# Patient Record
Sex: Female | Born: 1985 | Race: Black or African American | Hispanic: No | Marital: Married | Smoking: Never smoker
Health system: Southern US, Community
[De-identification: ages and names within clinical notes are randomized; demographics above are authoritative.]

## PROBLEM LIST (undated history)

## (undated) DIAGNOSIS — D649 Anemia, unspecified: Secondary | ICD-10-CM

## (undated) DIAGNOSIS — J40 Bronchitis, not specified as acute or chronic: Secondary | ICD-10-CM

---
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
## 2006-12-23 ENCOUNTER — Emergency Department (HOSPITAL_COMMUNITY): Admission: EM | Admit: 2006-12-23 | Discharge: 2006-12-23 | Payer: Self-pay | Admitting: Emergency Medicine

## 2007-09-01 ENCOUNTER — Ambulatory Visit: Payer: Self-pay | Admitting: Internal Medicine

## 2007-09-04 ENCOUNTER — Ambulatory Visit: Payer: Self-pay | Admitting: Internal Medicine

## 2008-08-19 ENCOUNTER — Emergency Department (HOSPITAL_COMMUNITY): Admission: EM | Admit: 2008-08-19 | Discharge: 2008-08-19 | Payer: Self-pay | Admitting: Emergency Medicine

## 2008-08-20 ENCOUNTER — Ambulatory Visit: Payer: Self-pay | Admitting: *Deleted

## 2009-05-29 ENCOUNTER — Ambulatory Visit: Payer: Self-pay | Admitting: Internal Medicine

## 2009-06-19 ENCOUNTER — Ambulatory Visit: Payer: Self-pay | Admitting: Internal Medicine

## 2009-07-07 ENCOUNTER — Ambulatory Visit: Payer: Self-pay | Admitting: Internal Medicine

## 2010-06-22 LAB — RAPID STREP SCREEN (MED CTR MEBANE ONLY): Streptococcus, Group A Screen (Direct): NEGATIVE

## 2010-12-24 LAB — POCT PREGNANCY, URINE
Operator id: 198171
Preg Test, Ur: NEGATIVE

## 2014-03-09 ENCOUNTER — Encounter (HOSPITAL_COMMUNITY): Payer: Self-pay | Admitting: *Deleted

## 2014-03-09 ENCOUNTER — Emergency Department (HOSPITAL_COMMUNITY): Payer: BC Managed Care – PPO

## 2014-03-09 ENCOUNTER — Emergency Department (HOSPITAL_COMMUNITY)
Admission: EM | Admit: 2014-03-09 | Discharge: 2014-03-09 | Disposition: A | Discharge status: Home / Self Care | Payer: BC Managed Care – PPO | Attending: Emergency Medicine | Admitting: Emergency Medicine

## 2014-03-09 DIAGNOSIS — Z3202 Encounter for pregnancy test, result negative: Secondary | ICD-10-CM | POA: Insufficient documentation

## 2014-03-09 DIAGNOSIS — R112 Nausea with vomiting, unspecified: Secondary | ICD-10-CM | POA: Insufficient documentation

## 2014-03-09 DIAGNOSIS — Z880 Allergy status to penicillin: Secondary | ICD-10-CM | POA: Diagnosis not present

## 2014-03-09 DIAGNOSIS — R1013 Epigastric pain: Secondary | ICD-10-CM

## 2014-03-09 DIAGNOSIS — K5900 Constipation, unspecified: Secondary | ICD-10-CM | POA: Insufficient documentation

## 2014-03-09 LAB — COMPREHENSIVE METABOLIC PANEL
ALK PHOS: 68 U/L (ref 39–117)
ALT: 22 U/L (ref 0–35)
AST: 20 U/L (ref 0–37)
Albumin: 3.9 g/dL (ref 3.5–5.2)
Anion gap: 8 (ref 5–15)
BUN: 6 mg/dL (ref 6–23)
CO2: 23 mmol/L (ref 19–32)
CREATININE: 0.78 mg/dL (ref 0.50–1.10)
Calcium: 9.2 mg/dL (ref 8.4–10.5)
Chloride: 105 mEq/L (ref 96–112)
GFR calc Af Amer: >90 mL/min (ref >90)
Glucose, Bld: 97 mg/dL (ref 70–99)
POTASSIUM: 3.4 mmol/L — AB (ref 3.5–5.1)
Sodium: 136 mmol/L (ref 135–145)
Total Bilirubin: 0.6 mg/dL (ref 0.3–1.2)
Total Protein: 7.7 g/dL (ref 6.0–8.3)

## 2014-03-09 LAB — CBC WITH DIFFERENTIAL/PLATELET
Basophils Absolute: 0 10*3/uL (ref 0.0–0.1)
Basophils Relative: 1 % (ref 0–1)
Eosinophils Absolute: 0.1 10*3/uL (ref 0.0–0.7)
Eosinophils Relative: 1 % (ref 0–5)
HEMATOCRIT: 32 % — AB (ref 36.0–46.0)
HEMOGLOBIN: 10 g/dL — AB (ref 12.0–15.0)
Lymphocytes Relative: 21 % (ref 12–46)
Lymphs Abs: 1.2 10*3/uL (ref 0.7–4.0)
MCH: 21.9 pg — ABNORMAL LOW (ref 26.0–34.0)
MCHC: 31.3 g/dL (ref 30.0–36.0)
MCV: 70 fL — ABNORMAL LOW (ref 78.0–100.0)
MONO ABS: 0.3 10*3/uL (ref 0.1–1.0)
MONOS PCT: 5 % (ref 3–12)
NEUTROS ABS: 4 10*3/uL (ref 1.7–7.7)
NEUTROS PCT: 72 % (ref 43–77)
Platelets: 551 10*3/uL — ABNORMAL HIGH (ref 150–400)
RBC: 4.57 MIL/uL (ref 3.87–5.11)
RDW: 17.7 % — ABNORMAL HIGH (ref 11.5–15.5)
WBC: 5.6 10*3/uL (ref 4.0–10.5)

## 2014-03-09 LAB — URINE MICROSCOPIC-ADD ON

## 2014-03-09 LAB — URINALYSIS, ROUTINE W REFLEX MICROSCOPIC
GLUCOSE, UA: NEGATIVE mg/dL
HGB URINE DIPSTICK: NEGATIVE
KETONES UR: 40 mg/dL — AB
NITRITE: NEGATIVE
PH: 7 (ref 5.0–8.0)
Protein, ur: NEGATIVE mg/dL
Specific Gravity, Urine: 1.027 (ref 1.005–1.030)
Urobilinogen, UA: 1 mg/dL (ref 0.0–1.0)

## 2014-03-09 LAB — POC URINE PREG, ED: Preg Test, Ur: NEGATIVE

## 2014-03-09 LAB — LIPASE, BLOOD: LIPASE: 43 U/L (ref 11–59)

## 2014-03-09 MED ORDER — SODIUM CHLORIDE 0.9 % IV BOLUS (SEPSIS): 1000.0000 mL | Freq: Once | INTRAVENOUS | Status: DC

## 2014-03-09 MED ORDER — DICYCLOMINE HCL 20 MG PO TABS: 20.0000 mg | ORAL_TABLET | Freq: Two times a day (BID) | ORAL | Status: DC

## 2014-03-09 MED ORDER — ONDANSETRON 4 MG PO TBDP
8.0000 mg | ORAL_TABLET | Freq: Once | ORAL | Status: AC
  Administered 2014-03-09: 8 mg via ORAL
  Filled 2014-03-09: qty 2

## 2014-03-09 MED ORDER — ONDANSETRON HCL 4 MG/2ML IJ SOLN
4.0000 mg | Freq: Once | INTRAMUSCULAR | Status: DC
  Filled 2014-03-09: qty 2

## 2014-03-09 MED ORDER — ONDANSETRON HCL 4 MG PO TABS
8.0000 mg | ORAL_TABLET | Freq: Once | ORAL | Status: AC
  Administered 2014-03-09: 8 mg via ORAL
  Filled 2014-03-09: qty 2

## 2014-03-09 MED ORDER — POLYETHYLENE GLYCOL 3350 17 GM/SCOOP PO POWD: ORAL | Status: DC

## 2014-03-09 MED ORDER — ONDANSETRON HCL 4 MG PO TABS: 4.0000 mg | ORAL_TABLET | Freq: Three times a day (TID) | ORAL | Status: DC | PRN

## 2014-03-09 NOTE — ED Notes (Signed)
IV team at bedside 

## 2014-03-09 NOTE — ED Notes (Signed)
Unsuccessful attempts to place an iv.

## 2014-03-09 NOTE — ED Notes (Signed)
Updated pt and mother with POC.  Pts mother upset with plan.  PA in room to speak with pt and her mother. Mother left room to go outside to smoke.

## 2014-03-09 NOTE — ED Provider Notes (Signed)
CSN: [id number]     Arrival date & time 03/09/14  1359 History   First MD Initiated Contact with Patient 03/09/14 1640     Chief Complaint  Patient presents with  . Abdominal Pain  . Emesis  . Constipation     (Consider location/radiation/quality/duration/timing/severity/associated sxs/prior Treatment) HPI Carmen Hays is a 28 year old female who presents to the emergency department with chief complaint of epigastric pain, nausea and vomiting. Patient states that for the past 2 days. She's had severe upper abdominal cramping pain with associated vomiting of nonbilious, nonbloody vomitus. She has had associated constipation. 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 Prior to this, the patient was thought to have a dental pain and had been on narcotic pain medication for approximately 3 weeks and had 2 rounds of antibiotics. Patient complains of severe constipation for the past week. She states she did have a significant sized bowel movement 6 days ago, but has not had a bowel movement since. She did not take any medication for constipation. She did take Pepto-Bismol yesterday because of her nausea and vomiting. She has not been able to hold down solid foods. She has been able to hold down liquids intermittently between spells of vomiting. Last menstrual period was 2 weeks ago. She denies any urinary symptoms. She has associated bilateral back pain which is worse with vomiting. . She denies any fevers, chills, myalgias or contacts with similar symptoms. No history of abdominal surgeries.   History reviewed. No pertinent past medical history. History reviewed. No pertinent past surgical history. History reviewed. No pertinent family history. History  Substance Use Topics  . Smoking status: Never Smoker   . Smokeless tobacco: Not on file  . Alcohol Use: No   OB History    No data available     Review of Systems  Ten systems  reviewed and are negative for acute change, except as noted in the HPI.    Allergies  Amoxicillin  Home Medications   Prior to Admission medications   Not on File   BP 104/69 mmHg  Pulse 91  Temp(Src) 98 F (36.7 C) (Oral)  Resp 18  SpO2 99%  LMP 02/23/2014 Physical Exam Physical Exam  Nursing note and vitals reviewed. Constitutional: She is oriented to person, place, and time. She appears well-developed and well-nourished. No distress.  HENT:  Head: Normocephalic and atraumatic.  Eyes: Conjunctivae normal and EOM are normal. Pupils are equal, round, and reactive to light. No scleral icterus.  Neck: Normal range of motion.  Cardiovascular: Normal rate, regular rhythm and normal heart sounds.  Exam reveals no gallop and no friction rub.   No murmur heard. Pulmonary/Chest: Effort normal and breath sounds normal. No respiratory distress.  Abdominal: Soft. Diffuse tenderness. Quiet abdomen with minimal bowel sounds on auscultation. Neurological: She is alert and oriented to person, place, and time.  Skin: Skin is warm and dry. She is not diaphoretic.    ED Course  Procedures (including critical care time) Labs Review Labs Reviewed  CBC WITH DIFFERENTIAL - Abnormal; Notable for the following:    Hemoglobin 10.0 (*)    HCT 32.0 (*)    MCV 70.0 (*)    MCH 21.9 (*)    RDW 17.7 (*)    Platelets 551 (*)    All other components within normal limits  COMPREHENSIVE METABOLIC PANEL - Abnormal; Notable for the following:    Potassium 3.4 (*)    All other components  within normal limits  LIPASE, BLOOD    Imaging Review No results found.   EKG Interpretation None      MDM   Final diagnoses:  Epigastric pain  Non-intractable vomiting with nausea, vomiting of unspecified type  Constipation, unspecified constipation type    5:29 PM BP 108/63 mmHg  Pulse 62  Temp(Src) 98 F (36.7 C) (Oral)  Resp 20  SpO2 100%  LMP 02/23/2014  Patient here with complaint of  constipation, abdominal pain and vomiting. Some mild microcytic anemia. Mild hypokalemia likely due to her vomiting. Lipase is normal with no effort. Chemistry abnormalities. Negative HEENT. Point-of-care urine pregnancy. Have ordered abdominal films.  /Patient is nontoxic, nonseptic appearing, in no apparent distress.  Patient's pain and other symptoms adequately managed in emergency department. Oral fluids given. Patient's appetite has returned..  Labs, imaging and vitals reviewed.  Patient does not meet the SIRS or Sepsis criteria.  On repeat exam patient does not have a surgical abdomin and there are no peritoneal signs.  No indication of appendicitis, bowel obstruction, bowel perforation, cholecystitis, diverticulitis, PID or ectopic pregnancy.  Patient discharged home with symptomatic treatment and given strict instructions for follow-up with their primary care physician.  I have also discussed reasons to return immediately to the ER.  Patient expresses understanding and agrees with plan.     Margarita Mail, PA-C 03/10/14 [place], MD 03/10/14 [phone]

## 2014-03-09 NOTE — Discharge Instructions (Signed)
Abdominal Pain °Many things can cause abdominal pain. Usually, abdominal pain is not caused by a disease and will improve without treatment. It can often be observed and treated at home. Your health care provider will do a physical exam and possibly order blood tests and X-rays to help determine the seriousness of your pain. However, in many cases, more time must pass before a clear cause of the pain can be found. Before that point, your health care provider may not know if you need more testing or further treatment. °HOME CARE INSTRUCTIONS  °Monitor your abdominal pain for any changes. The following actions may help to alleviate any discomfort you are experiencing: °· Only take over-the-counter or prescription medicines as directed by your health care provider. °· Do not take laxatives unless directed to do so by your health care provider. °· Try a clear liquid diet (broth, tea, or water) as directed by your health care provider. Slowly move to a bland diet as tolerated. °SEEK MEDICAL CARE IF: °· You have unexplained abdominal pain. °· You have abdominal pain associated with nausea or diarrhea. °· You have pain when you urinate or have a bowel movement. °· You experience abdominal pain that wakes you in the night. °· You have abdominal pain that is worsened or improved by eating food. °· You have abdominal pain that is worsened with eating fatty foods. °· You have a fever. °SEEK IMMEDIATE MEDICAL CARE IF:  °· Your pain does not go away within 2 hours. °· You keep throwing up (vomiting). °· Your pain is felt only in portions of the abdomen, such as the right side or the left lower portion of the abdomen. °· You pass bloody or black tarry stools. °MAKE SURE YOU: °· Understand these instructions.   °· Will watch your condition.   °· Will get help right away if you are not doing well or get worse.   °Document Released: 12/09/2004 Document Revised: 03/06/2013 Document Reviewed: 11/08/2012 °[employer]® Patient Information  ©2015 [employer]. This information is not intended to replace advice given to you by your health care provider. Make sure you discuss any questions you have with your health care provider. ° °Constipation °Constipation is when a person has fewer than three bowel movements a week, has difficulty having a bowel movement, or has stools that are dry, hard, or larger than normal. As people grow older, constipation is more common. If you try to fix constipation with medicines that make you have a bowel movement (laxatives), the problem may get worse. Long-term laxative use may cause the muscles of the colon to become weak. A low-fiber diet, not taking in enough fluids, and taking certain medicines may make constipation worse.  °CAUSES  °· Certain medicines, such as antidepressants, pain medicine, iron supplements, antacids, and water pills.   °· Certain diseases, such as diabetes, irritable bowel syndrome (IBS), thyroid disease, or depression.   °· Not drinking enough water.   °· Not eating enough fiber-rich foods.   °· Stress or travel.   °· Lack of physical activity or exercise.   °· Ignoring the urge to have a bowel movement.   °· Using laxatives too much.   °SIGNS AND SYMPTOMS  °· Having fewer than three bowel movements a week.   °· Straining to have a bowel movement.   °· Having stools that are hard, dry, or larger than normal.   °· Feeling full or bloated.   °· Pain in the lower abdomen.   °· Not feeling relief after having a bowel movement.   °DIAGNOSIS  °Your health care provider will take   a medical history and perform a physical exam. Further testing may be done for severe constipation. Some tests may include:  A barium enema X-ray to examine your rectum, colon, and, sometimes, your small intestine.   A sigmoidoscopy to examine your lower colon.   A colonoscopy to examine your entire colon. TREATMENT  Treatment will depend on the severity of your constipation and what is causing it. Some dietary  treatments include drinking more fluids and eating more fiber-rich foods. Lifestyle treatments may include regular exercise. If these diet and lifestyle recommendations do not help, your health care provider may recommend taking over-the-counter laxative medicines to help you have bowel movements. Prescription medicines may be prescribed if over-the-counter medicines do not work.  HOME CARE INSTRUCTIONS   Eat foods that have a lot of fiber, such as fruits, vegetables, whole grains, and beans.  Limit foods high in fat and processed sugars, such as french fries, hamburgers, cookies, candies, and soda.   A fiber supplement may be added to your diet if you cannot get enough fiber from foods.   Drink enough fluids to keep your urine clear or pale yellow.   Exercise regularly or as directed by your health care provider.   Go to the restroom when you have the urge to go. Do not hold it.   Only take over-the-counter or prescription medicines as directed by your health care provider. Do not take other medicines for constipation without talking to your health care provider first.  [place] IF:   You have bright red blood in your stool.   Your constipation lasts for more than 4 days or gets worse.   You have abdominal or rectal pain.   You have thin, pencil-like stools.   You have unexplained weight loss. MAKE SURE YOU:   Understand these instructions.  Will watch your condition.  Will get help right away if you are not doing well or get worse. Document Released: 11/28/2003 Document Revised: 03/06/2013 Document Reviewed: 12/11/2012 [facility] Patient Information 2015 [place], [place]. This information is not intended to replace advice given to you by your health care provider. Make sure you discuss any questions you have with your health care provider.  Nausea and Vomiting Nausea is a sick feeling that often comes before throwing up (vomiting). Vomiting is a  reflex where stomach contents come out of your mouth. Vomiting can cause severe loss of body fluids (dehydration). Children and elderly adults can become dehydrated quickly, especially if they also have diarrhea. Nausea and vomiting are symptoms of a condition or disease. It is important to find the cause of your symptoms. CAUSES   Direct irritation of the stomach lining. This irritation can result from increased acid production (gastroesophageal reflux disease), infection, food poisoning, taking certain medicines (such as nonsteroidal anti-inflammatory drugs), alcohol use, or tobacco use.  Signals from the brain.These signals could be caused by a headache, heat exposure, an inner ear disturbance, increased pressure in the brain from injury, infection, a tumor, or a concussion, pain, emotional stimulus, or metabolic problems.  An obstruction in the gastrointestinal tract (bowel obstruction).  Illnesses such as diabetes, hepatitis, gallbladder problems, appendicitis, kidney problems, cancer, sepsis, atypical symptoms of a heart attack, or eating disorders.  Medical treatments such as chemotherapy and radiation.  Receiving medicine that makes you sleep (general anesthetic) during surgery. DIAGNOSIS Your caregiver may ask for tests to be done if the problems do not improve after a few days. Tests may also be done if symptoms  are severe or if the reason for the nausea and vomiting is not clear. Tests may include:  Urine tests.  Blood tests.  Stool tests.  Cultures (to look for evidence of infection).  X-rays or other imaging studies. Test results can help your caregiver make decisions about treatment or the need for additional tests. TREATMENT You need to stay well hydrated. Drink frequently but in small amounts.You may wish to drink water, sports drinks, clear broth, or eat frozen ice pops or gelatin dessert to help stay hydrated.When you eat, eating slowly may help prevent nausea.There  are also some antinausea medicines that may help prevent nausea. HOME CARE INSTRUCTIONS   Take all medicine as directed by your caregiver.  If you do not have an appetite, do not force yourself to eat. However, you must continue to drink fluids.  If you have an appetite, eat a normal diet unless your caregiver tells you differently.  Eat a variety of complex carbohydrates (rice, wheat, potatoes, bread), lean meats, yogurt, fruits, and vegetables.  Avoid high-fat foods because they are more difficult to digest.  Drink enough water and fluids to keep your urine clear or pale yellow.  If you are dehydrated, ask your caregiver for specific rehydration instructions. Signs of dehydration may include:  Severe thirst.  Dry lips and mouth.  Dizziness.  Dark urine.  Decreasing urine frequency and amount.  Confusion.  Rapid breathing or pulse. SEEK IMMEDIATE MEDICAL CARE IF:   You have blood or brown flecks (like coffee grounds) in your vomit.  You have black or bloody stools.  You have a severe headache or stiff neck.  You are confused.  You have severe abdominal pain.  You have chest pain or trouble breathing.  You do not urinate at least once every 8 hours.  You develop cold or clammy skin.  You continue to vomit for longer than 24 to 48 hours.  You have a fever. MAKE SURE YOU:   Understand these instructions.  Will watch your condition.  Will get help right away if you are not doing well or get worse. Document Released: 03/01/2005 Document Revised: 05/24/2011 Document Reviewed: 07/29/2010 [facility] Patient Information 2015 [place], [place]. This information is not intended to replace advice given to you by your health care provider. Make sure you discuss any questions you have with your health care provider.

## 2014-03-09 NOTE — ED Notes (Signed)
Pt does not want blood drawn at triage.

## 2014-03-09 NOTE — ED Notes (Signed)
Pt reports feeling bad x 2 days. Having n/v and upper abd pain, feels constipated and reports no bowel movement since earlier in the week, has not taken any laxatives.

## 2015-10-07 IMAGING — DX DG ABDOMEN ACUTE W/ 1V CHEST
3 series · 3 of 3 positions shown · non-contrast
Comparison: None.

CLINICAL DATA: 28-year-old female with abdominal pain and vomiting
for 2 weeks. Initial encounter.

EXAM:
ACUTE ABDOMEN SERIES (ABDOMEN 2 VIEW & CHEST 1 VIEW)

[chest pa]
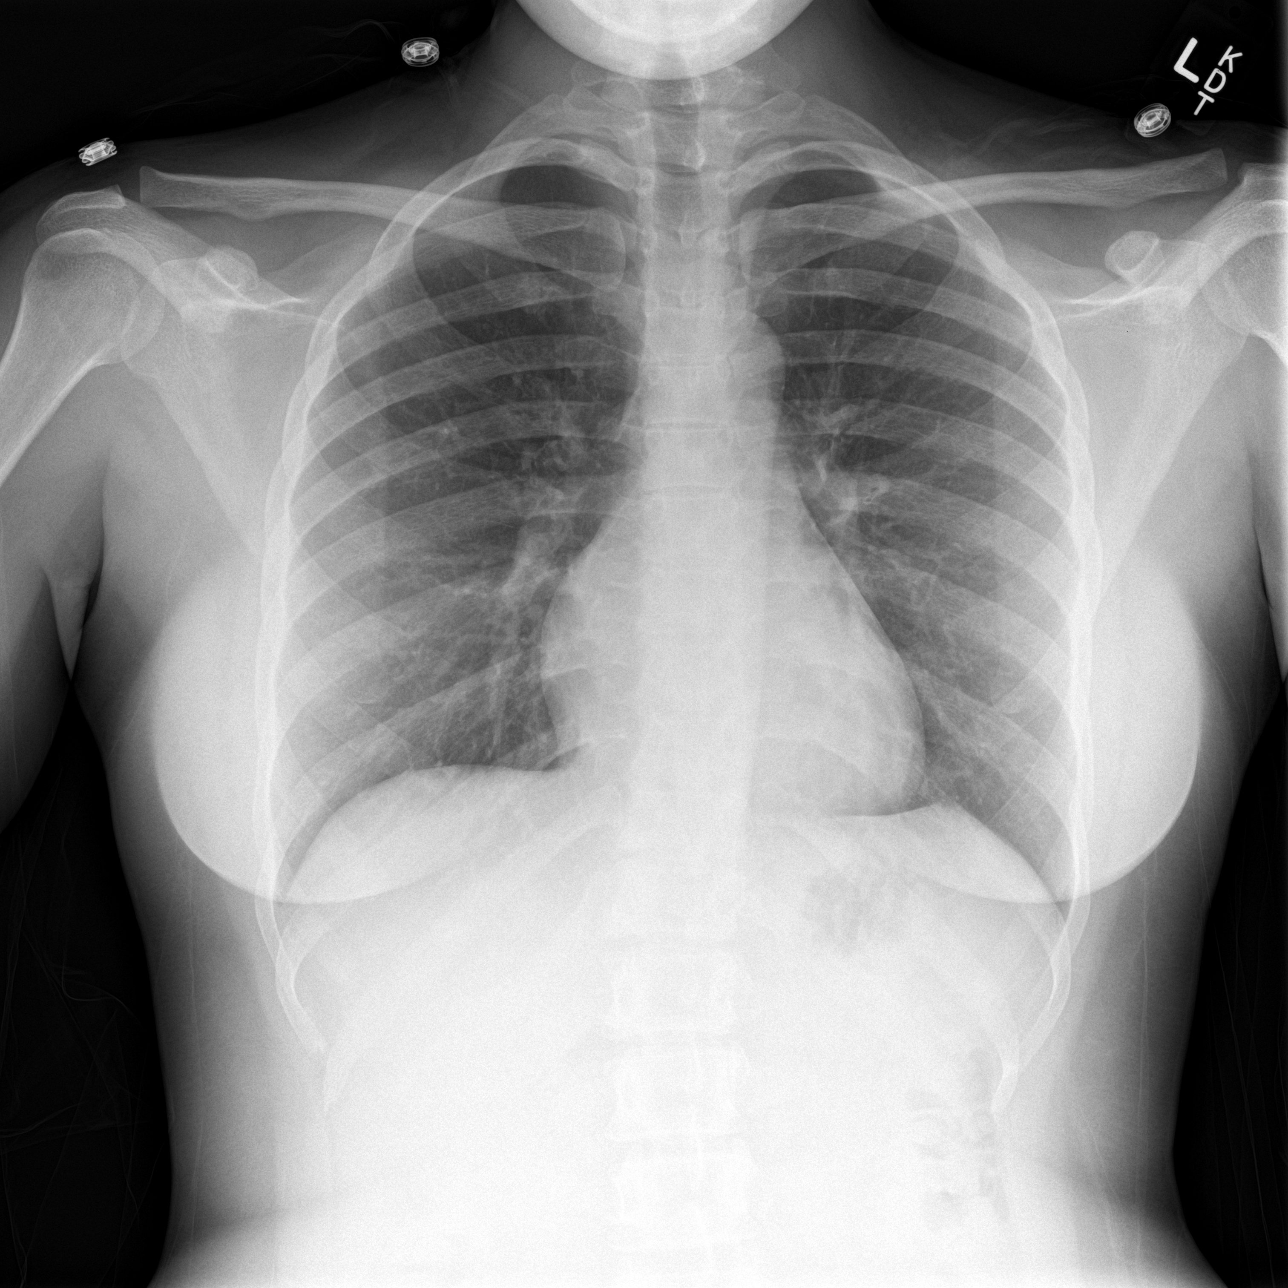

[abdomen erect]
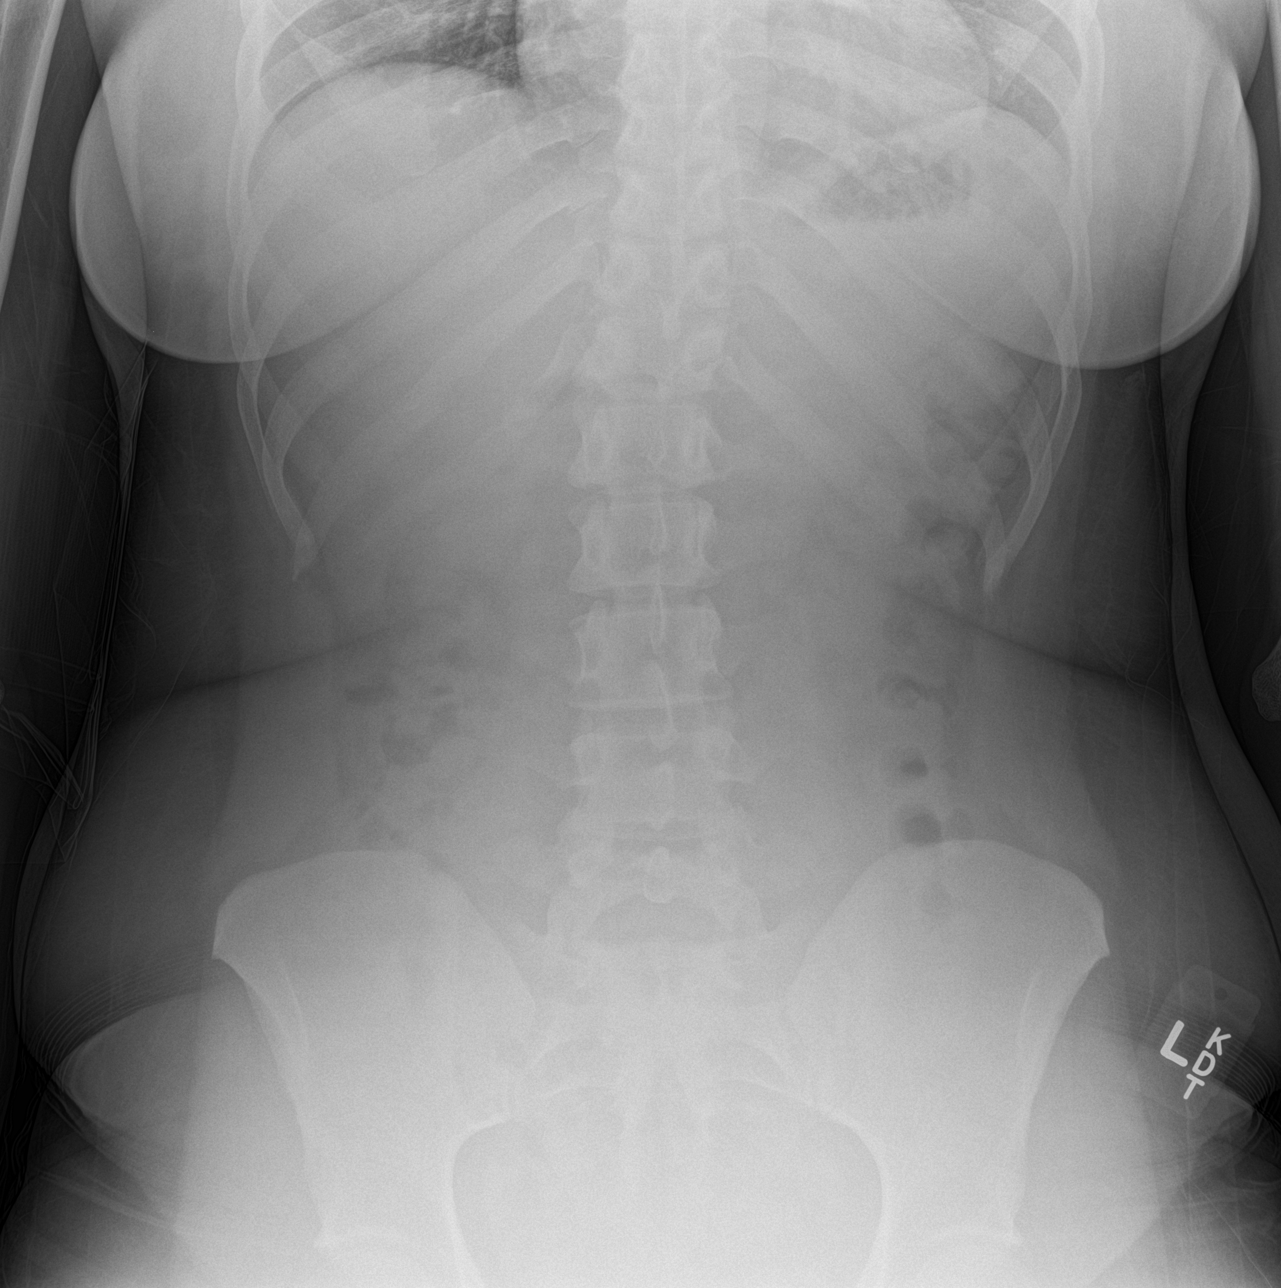

[abdomen supine]
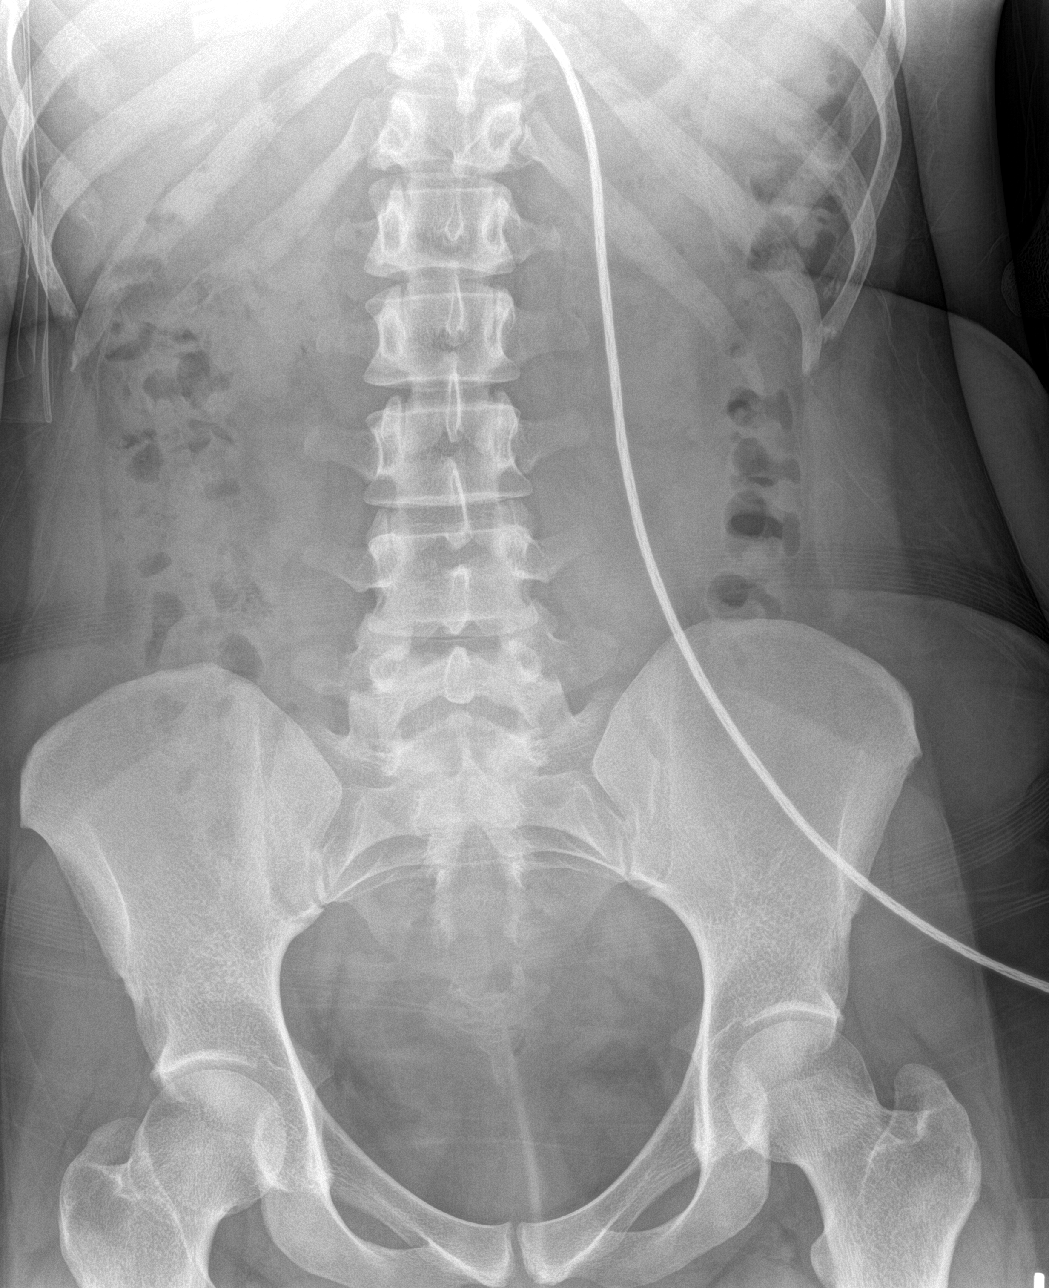

[3 of 3 positions shown; findings below may reference images not displayed]

FINDINGS: Lung volumes are normal. Normal cardiac size and mediastinal
contours. Visualized tracheal air column is within normal limits. No
pneumothorax or pneumoperitoneum. No confluent pulmonary opacity.

Mild scoliosis. Non obstructed bowel gas pattern. Abdominal and
pelvic visceral contours are within normal limits. No acute osseous
abnormality identified. Mild volume of retained stool mid colon.
IMPRESSION: 1.  Non obstructed bowel gas pattern, no free air.
2.  No acute cardiopulmonary abnormality.

## 2018-05-05 ENCOUNTER — Encounter (HOSPITAL_COMMUNITY): Payer: Self-pay

## 2018-05-05 ENCOUNTER — Ambulatory Visit (HOSPITAL_COMMUNITY)
Admission: EM | Admit: 2018-05-05 | Discharge: 2018-05-05 | Disposition: A | Discharge status: Home / Self Care | Payer: Self-pay | Attending: Internal Medicine | Admitting: Internal Medicine

## 2018-05-05 DIAGNOSIS — J40 Bronchitis, not specified as acute or chronic: Secondary | ICD-10-CM

## 2018-05-05 MED ORDER — PREDNISONE 10 MG PO TABS: 20.0000 mg | ORAL_TABLET | Freq: Every day | ORAL | 0 refills | Status: AC

## 2018-05-05 MED ORDER — ALBUTEROL SULFATE HFA 108 (90 BASE) MCG/ACT IN AERS: 2.0000 | INHALATION_SPRAY | RESPIRATORY_TRACT | 1 refills | Status: DC | PRN

## 2018-05-05 NOTE — ED Provider Notes (Signed)
[facility]    CSN: [id number] Arrival date & time: 05/05/18  Seba Dalkai     History   Chief Complaint Chief Complaint  Patient presents with  . Cough  . Nasal Congestion    HPI Carmen Hays is a 33 y.o. female.   Patient with no chronic medical problems presents to urgent care complaining of cough x3.5 weeks.  The cough is non-productive but the patient is concerned because she feels rattling in her chest.  She denies fevers or SOB.       History reviewed. No pertinent past medical history.  There are no active problems to display for this patient.   History reviewed. No pertinent surgical history.  OB History   No obstetric history on file.      Home Medications    Prior to Admission medications   Medication Sig Start Date End Date Taking? Authorizing Provider  albuterol (PROVENTIL HFA;VENTOLIN HFA) 108 (90 Base) MCG/ACT inhaler Inhale 2 puffs into the lungs every 4 (four) hours as needed for wheezing or shortness of breath. 05/05/18   Harrie Foreman, MD  dicyclomine (BENTYL) 20 MG tablet Take 1 tablet (20 mg total) by mouth 2 (two) times daily. 03/09/14   Margarita Mail, PA-C  gabapentin (NEURONTIN) 300 MG capsule Take 300 mg by mouth 3 (three) times daily.    [provider]  ondansetron (ZOFRAN) 4 MG tablet Take 1 tablet (4 mg total) by mouth every 8 (eight) hours as needed for nausea or vomiting. 03/09/14   Margarita Mail, PA-C  polyethylene glycol powder (GLYCOLAX/MIRALAX) powder Take 6 capfuls in 32 ounces of water before bed. 03/09/14   Margarita Mail, PA-C  predniSONE (DELTASONE) 10 MG tablet Take 2 tablets (20 mg total) by mouth daily for 4 days. 05/05/18 05/09/18  Harrie Foreman, MD    Family History History reviewed. No pertinent family history.  Social History Social History   Tobacco Use  . Smoking status: Never Smoker  . Smokeless tobacco: Never Used  Substance Use Topics  . Alcohol use: No  . Drug use: No      Allergies   Amoxicillin   Review of Systems Review of Systems  Constitutional: Negative for chills and fever.  HENT: Negative for sore throat and tinnitus.   Eyes: Negative for redness.  Respiratory: Positive for cough. Negative for shortness of breath.   Cardiovascular: Negative for chest pain and palpitations.  Gastrointestinal: Negative for abdominal pain, diarrhea, nausea and vomiting.  Genitourinary: Negative for dysuria, frequency and urgency.  Musculoskeletal: Negative for myalgias.  Skin: Negative for rash.       No lesions  Neurological: Negative for weakness.  Hematological: Does not bruise/bleed easily.  Psychiatric/Behavioral: Negative for suicidal ideas.     Physical Exam Triage Vital Signs ED Triage Vitals  Enc Vitals Group     BP 05/05/18 1923 (!) 93/56     Pulse Rate 05/05/18 1923 98     Resp --      Temp 05/05/18 1923 100.3 F (37.9 C)     Temp Source 05/05/18 1923 Skin     SpO2 05/05/18 1923 100 %     Weight --      Height --      Head Circumference --      Peak Flow --      Pain Score 05/05/18 1924 0     Pain Loc --      Pain Edu? --      Excl. in  GC? --    No data found.  Updated Vital Signs BP (!) 93/56 (BP Location: Right Arm)   Pulse 98   Temp 100.3 F (37.9 C) (Skin)   LMP 05/05/2018   SpO2 100%   Visual Acuity Right Eye Distance:   Left Eye Distance:   Bilateral Distance:    Right Eye Near:   Left Eye Near:    Bilateral Near:     Physical Exam Vitals signs and nursing note reviewed.  Constitutional:      General: She is not in acute distress.    Appearance: She is well-developed.  HENT:     Head: Normocephalic and atraumatic.  Eyes:     General: No scleral icterus.    Conjunctiva/sclera: Conjunctivae normal.     Pupils: Pupils are equal, round, and reactive to light.  Neck:     Musculoskeletal: Normal range of motion and neck supple.     Thyroid: No thyromegaly.     Vascular: No JVD.     Trachea: No tracheal  deviation.  Cardiovascular:     Rate and Rhythm: Normal rate and regular rhythm.     Heart sounds: Normal heart sounds. No murmur. No friction rub. No gallop.   Pulmonary:     Effort: Pulmonary effort is normal.     Breath sounds: Normal breath sounds.  Abdominal:     General: Bowel sounds are normal. There is no distension.     Palpations: Abdomen is soft.     Tenderness: There is no abdominal tenderness.  Musculoskeletal: Normal range of motion.  Lymphadenopathy:     Cervical: No cervical adenopathy.  Skin:    General: Skin is warm and dry.  Neurological:     Mental Status: She is alert and oriented to person, place, and time.     Cranial Nerves: No cranial nerve deficit.  Psychiatric:        Behavior: Behavior normal.        Thought Content: Thought content normal.        Judgment: Judgment normal.      UC Treatments / Results  Labs (all labs ordered are listed, but only abnormal results are displayed) Labs Reviewed - No data to display  EKG None  Radiology No results found.  Procedures Procedures (including critical care time)  Medications Ordered in UC Medications - No data to display  Initial Impression / Assessment and Plan / UC Course  I have reviewed the triage vital signs and the nursing notes.  Pertinent labs & imaging results that were available during my care of the patient were reviewed by me and considered in my medical decision making (see chart for details).     No indication of pneumonia.  Discussed definition of bronchitis and that she would need a CXR if cough persisted another 2 weeks.    Final Clinical Impressions(s) / UC Diagnoses   Final diagnoses:  Bronchitis   Discharge Instructions   None    ED Prescriptions    Medication Sig Dispense Auth. Provider   albuterol (PROVENTIL HFA;VENTOLIN HFA) 108 (90 Base) MCG/ACT inhaler Inhale 2 puffs into the lungs every 4 (four) hours as needed for wheezing or shortness of breath. 1 Inhaler  Harrie Foreman, MD   predniSONE (DELTASONE) 10 MG tablet Take 2 tablets (20 mg total) by mouth daily for 4 days. 8 tablet Harrie Foreman, MD     Controlled Substance Prescriptions [place] Controlled Substance Registry consulted? Not Applicable   Rosilyn Mings  S, MD 05/05/18 2100

## 2019-02-23 ENCOUNTER — Encounter (HOSPITAL_COMMUNITY): Payer: Self-pay | Admitting: Emergency Medicine

## 2019-02-23 ENCOUNTER — Other Ambulatory Visit: Payer: Self-pay

## 2019-02-23 ENCOUNTER — Inpatient Hospital Stay (HOSPITAL_COMMUNITY): Payer: Self-pay

## 2019-02-23 ENCOUNTER — Inpatient Hospital Stay (HOSPITAL_COMMUNITY)
Admission: EM | Admit: 2019-02-23 | Discharge: 2019-02-24 | DRG: 760 | Disposition: A | Discharge status: Home / Self Care | Payer: Self-pay | Attending: Obstetrics and Gynecology | Admitting: Obstetrics and Gynecology

## 2019-02-23 DIAGNOSIS — Z20828 Contact with and (suspected) exposure to other viral communicable diseases: Secondary | ICD-10-CM | POA: Diagnosis present

## 2019-02-23 DIAGNOSIS — D649 Anemia, unspecified: Secondary | ICD-10-CM

## 2019-02-23 DIAGNOSIS — D62 Acute posthemorrhagic anemia: Secondary | ICD-10-CM | POA: Diagnosis present

## 2019-02-23 DIAGNOSIS — N939 Abnormal uterine and vaginal bleeding, unspecified: Secondary | ICD-10-CM

## 2019-02-23 DIAGNOSIS — D219 Benign neoplasm of connective and other soft tissue, unspecified: Secondary | ICD-10-CM

## 2019-02-23 DIAGNOSIS — N920 Excessive and frequent menstruation with regular cycle: Secondary | ICD-10-CM | POA: Diagnosis present

## 2019-02-23 DIAGNOSIS — D259 Leiomyoma of uterus, unspecified: Secondary | ICD-10-CM

## 2019-02-23 DIAGNOSIS — R1900 Intra-abdominal and pelvic swelling, mass and lump, unspecified site: Secondary | ICD-10-CM

## 2019-02-23 DIAGNOSIS — D250 Submucous leiomyoma of uterus: Principal | ICD-10-CM | POA: Diagnosis present

## 2019-02-23 LAB — CBC
HCT: 23.8 % — ABNORMAL LOW (ref 36.0–46.0)
Hemoglobin: 6 g/dL — CL (ref 12.0–15.0)
MCH: 15.4 pg — ABNORMAL LOW (ref 26.0–34.0)
MCHC: 25.2 g/dL — ABNORMAL LOW (ref 30.0–36.0)
MCV: 61 fL — ABNORMAL LOW (ref 80.0–100.0)
Platelets: 353 10*3/uL (ref 150–400)
RBC: 3.9 MIL/uL (ref 3.87–5.11)
RDW: 22.2 % — ABNORMAL HIGH (ref 11.5–15.5)
WBC: 6 10*3/uL (ref 4.0–10.5)
nRBC: 0 % (ref 0.0–0.2)

## 2019-02-23 LAB — URINALYSIS, ROUTINE W REFLEX MICROSCOPIC
Bilirubin Urine: NEGATIVE
Glucose, UA: NEGATIVE mg/dL
Ketones, ur: NEGATIVE mg/dL
Leukocytes,Ua: NEGATIVE
Nitrite: NEGATIVE
Protein, ur: NEGATIVE mg/dL
RBC / HPF: >50 RBC/hpf — ABNORMAL HIGH (ref 0–5)
Specific Gravity, Urine: 1.01 (ref 1.005–1.030)
pH: 8 (ref 5.0–8.0)

## 2019-02-23 LAB — WET PREP, GENITAL
Clue Cells Wet Prep HPF POC: NONE SEEN
Sperm: NONE SEEN
Trich, Wet Prep: NONE SEEN
Yeast Wet Prep HPF POC: NONE SEEN

## 2019-02-23 LAB — FERRITIN: Ferritin: 2 ng/mL — ABNORMAL LOW (ref 11–307)

## 2019-02-23 LAB — BASIC METABOLIC PANEL
Anion gap: 9 (ref 5–15)
BUN: <5 mg/dL — ABNORMAL LOW (ref 6–20)
CO2: 21 mmol/L — ABNORMAL LOW (ref 22–32)
Calcium: 8.7 mg/dL — ABNORMAL LOW (ref 8.9–10.3)
Chloride: 109 mmol/L (ref 98–111)
Creatinine, Ser: 0.63 mg/dL (ref 0.44–1.00)
GFR calc Af Amer: >60 mL/min (ref >60)
GFR calc non Af Amer: >60 mL/min (ref >60)
Glucose, Bld: 93 mg/dL (ref 70–99)
Potassium: 3.7 mmol/L (ref 3.5–5.1)
Sodium: 139 mmol/L (ref 135–145)

## 2019-02-23 LAB — PREPARE RBC (CROSSMATCH)

## 2019-02-23 LAB — I-STAT BETA HCG BLOOD, ED (MC, WL, AP ONLY): I-stat hCG, quantitative: <5 m[IU]/mL (ref <5)

## 2019-02-23 LAB — RETICULOCYTES
Immature Retic Fract: 17.2 % — ABNORMAL HIGH (ref 2.3–15.9)
RBC.: 3.96 MIL/uL (ref 3.87–5.11)
Retic Count, Absolute: 45.9 10*3/uL (ref 19.0–186.0)
Retic Ct Pct: 1.2 % (ref 0.4–3.1)

## 2019-02-23 LAB — IRON AND TIBC
Iron: 11 ug/dL — ABNORMAL LOW (ref 28–170)
Saturation Ratios: 3 % — ABNORMAL LOW (ref 10.4–31.8)
TIBC: 440 ug/dL (ref 250–450)
UIBC: 429 ug/dL

## 2019-02-23 LAB — SARS CORONAVIRUS 2 (TAT 6-24 HRS): SARS Coronavirus 2: NEGATIVE

## 2019-02-23 LAB — FOLATE: Folate: 5.7 ng/mL — ABNORMAL LOW (ref >5.9)

## 2019-02-23 LAB — VITAMIN B12: Vitamin B-12: 298 pg/mL (ref 180–914)

## 2019-02-23 LAB — ABO/RH: ABO/RH(D): B POS

## 2019-02-23 MED ORDER — DIPHENHYDRAMINE HCL 25 MG PO CAPS
25.0000 mg | ORAL_CAPSULE | Freq: Once | ORAL | Status: AC
  Administered 2019-02-23: 25 mg via ORAL
  Filled 2019-02-23: qty 1

## 2019-02-23 MED ORDER — MEGESTROL ACETATE 40 MG PO TABS
40.0000 mg | ORAL_TABLET | Freq: Two times a day (BID) | ORAL | Status: DC
  Administered 2019-02-23 – 2019-02-24 (×2): 40 mg via ORAL
  Filled 2019-02-23 (×3): qty 1

## 2019-02-23 MED ORDER — PRENATAL MULTIVITAMIN CH
1.0000 | ORAL_TABLET | Freq: Every day | ORAL | Status: DC
  Filled 2019-02-23: qty 1

## 2019-02-23 MED ORDER — SODIUM CHLORIDE 0.9% IV SOLUTION: Freq: Once | INTRAVENOUS | Status: DC

## 2019-02-23 MED ORDER — OXYCODONE-ACETAMINOPHEN 5-325 MG PO TABS
1.0000 | ORAL_TABLET | Freq: Once | ORAL | Status: AC
  Administered 2019-02-24: 1 via ORAL
  Filled 2019-02-23: qty 1

## 2019-02-23 MED ORDER — SODIUM CHLORIDE 0.9% IV SOLUTION
Freq: Once | INTRAVENOUS | Status: AC
  Administered 2019-02-23: 17:00:00 via INTRAVENOUS

## 2019-02-23 MED ORDER — ACETAMINOPHEN 325 MG PO TABS
650.0000 mg | ORAL_TABLET | Freq: Once | ORAL | Status: AC
  Administered 2019-02-23: 18:00:00 650 mg via ORAL
  Filled 2019-02-23: qty 2

## 2019-02-23 NOTE — H&P (Signed)
Carmen Hays is an 33 y.o. female presenting for the evaluation of heavy vaginal bleeding. She reports soaking a pad and tampon over an hour. She states that over the past 4 months her periods have become heavy in flow lasting nearly 10 days. Her last period started on 11/14 and ended on 11/21. She restarted bleeding this morning associated with dysmenorrhea not relieved by naproxen. She reports generalized fatigue and weakness. Patient denies chest pain or shortness of breath. Patient is otherwise without complaints.   Menstrual History: Patient's last menstrual period was 02/23/2019.    History reviewed. No pertinent past medical history.  No past surgical history on file.  No family history on file.  Social History:  reports that she has never smoked. She has never used smokeless tobacco. She reports that she does not drink alcohol or use drugs.  Allergies:  Allergies  Allergen Reactions  . Amoxicillin Rash    Vaginal swelling id it involve swelling of the face/tongue/throat, SOB, or low BP? No Did it involve sudden or severe rash/hives, skin peeling, or any reaction on the inside of your mouth or nose? Yes Did you need to seek medical attention at a hospital or doctor's office? No When did it last happen?33 yrs old If all above answers are "NO", may proceed with cephalosporin use.    Medications Prior to Admission  Medication Sig Dispense Refill Last Dose  . naproxen sodium (ALEVE) 220 MG tablet Take 1,760 mg by mouth daily as needed (pain/cramps).   2 weeks ago  . albuterol (PROVENTIL HFA;VENTOLIN HFA) 108 (90 Base) MCG/ACT inhaler Inhale 2 puffs into the lungs every 4 (four) hours as needed for wheezing or shortness of breath. (Patient not taking: Reported on 02/23/2019) 1 Inhaler 1 Not Taking at Unknown time  . dicyclomine (BENTYL) 20 MG tablet Take 1 tablet (20 mg total) by mouth 2 (two) times daily. (Patient not taking: Reported on 02/23/2019) 20 tablet 0 Not Taking  at Unknown time  . ondansetron (ZOFRAN) 4 MG tablet Take 1 tablet (4 mg total) by mouth every 8 (eight) hours as needed for nausea or vomiting. (Patient not taking: Reported on 02/23/2019) 10 tablet 0 Not Taking at Unknown time  . polyethylene glycol powder (GLYCOLAX/MIRALAX) powder Take 6 capfuls in 32 ounces of water before bed. (Patient not taking: Reported on 02/23/2019) 225 g 0 Not Taking at Unknown time    Review of Systems See pertinent in HPI Blood pressure 108/60, pulse 80, temperature 98.9 F (37.2 C), temperature source Oral, resp. rate 19, last menstrual period 02/23/2019, SpO2 100 %. Physical Exam GENERAL: Well-developed, well-nourished female in no acute distress.  LUNGS: Clear to auscultation bilaterally.  HEART: Regular rate and rhythm. ABDOMEN: Soft, nontender, nondistended. Palpable enlarged uterus PELVIC: Not performed EXTREMITIES: No cyanosis, clubbing, or edema, 2+ distal pulses.  Results for orders placed or performed during the hospital encounter of 02/23/19 (from the past 24 hour(s))  CBC     Status: Abnormal   Collection Time: 02/23/19  2:55 PM  Result Value Ref Range   WBC 6.0 4.0 - 10.5 K/uL   RBC 3.90 3.87 - 5.11 MIL/uL   Hemoglobin 6.0 (LL) 12.0 - 15.0 g/dL   HCT 23.8 (L) 36.0 - 46.0 %   MCV 61.0 (L) 80.0 - 100.0 fL   MCH 15.4 (L) 26.0 - 34.0 pg   MCHC 25.2 (L) 30.0 - 36.0 g/dL   RDW 22.2 (H) 11.5 - 15.5 %   Platelets 353 150 - 400  K/uL   nRBC 0.0 0.0 - 0.2 %  Basic metabolic panel     Status: Abnormal   Collection Time: 02/23/19  2:55 PM  Result Value Ref Range   Sodium 139 135 - 145 mmol/L   Potassium 3.7 3.5 - 5.1 mmol/L   Chloride 109 98 - 111 mmol/L   CO2 21 (L) 22 - 32 mmol/L   Glucose, Bld 93 70 - 99 mg/dL   BUN <5 (L) 6 - 20 mg/dL   Creatinine, Ser 0.63 0.44 - 1.00 mg/dL   Calcium 8.7 (L) 8.9 - 10.3 mg/dL   GFR calc non Af Amer >60 >60 mL/min   GFR calc Af Amer >60 >60 mL/min   Anion gap 9 5 - 15  Reticulocytes     Status: Abnormal    Collection Time: 02/23/19  2:55 PM  Result Value Ref Range   Retic Ct Pct 1.2 0.4 - 3.1 %   RBC. 3.96 3.87 - 5.11 MIL/uL   Retic Count, Absolute 45.9 19.0 - 186.0 K/uL   Immature Retic Fract 17.2 (H) 2.3 - 15.9 %  I-Stat beta hCG blood, ED     Status: None   Collection Time: 02/23/19  3:12 PM  Result Value Ref Range   I-stat hCG, quantitative <5.0 <5 mIU/mL   Comment 3          Vitamin B12     Status: None   Collection Time: 02/23/19  4:07 PM  Result Value Ref Range   Vitamin B-12 298 180 - 914 pg/mL  Folate     Status: Abnormal   Collection Time: 02/23/19  4:07 PM  Result Value Ref Range   Folate 5.7 (L) >5.9 ng/mL  Iron and TIBC     Status: Abnormal   Collection Time: 02/23/19  4:07 PM  Result Value Ref Range   Iron 11 (L) 28 - 170 ug/dL   TIBC 440 250 - 450 ug/dL   Saturation Ratios 3 (L) 10.4 - 31.8 %   UIBC 429 ug/dL  Ferritin     Status: Abnormal   Collection Time: 02/23/19  4:07 PM  Result Value Ref Range   Ferritin 2 (L) 11 - 307 ng/mL  Type and screen [place]     Status: None (Preliminary result)   Collection Time: 02/23/19  4:10 PM  Result Value Ref Range   ABO/RH(D) B POS    Antibody Screen NEG    Sample Expiration      02/26/2019,2359 Performed at [facility], [place] [address], [place], [place] [address]    Unit Number [id number]    Blood Component Type RED CELLS,LR    Unit division 00    Status of Unit ISSUED    Transfusion Status OK TO TRANSFUSE    Crossmatch Result Compatible    Unit Number [id number]    Blood Component Type RBC LR PHER2    Unit division 00    Status of Unit ALLOCATED    Transfusion Status OK TO TRANSFUSE    Crossmatch Result Compatible    Unit Number [id number]    Blood Component Type RED CELLS,LR    Unit division 00    Status of Unit ALLOCATED    Transfusion Status OK TO TRANSFUSE    Crossmatch Result Compatible   ABO/Rh     Status: None   Collection Time: 02/23/19  4:10 PM  Result Value  Ref Range   ABO/RH(D)      B POS Performed  at [facility], [place] [address] [place], [place] [address]   Prepare RBC     Status: None   Collection Time: 02/23/19  4:28 PM  Result Value Ref Range   Order Confirmation      ORDER PROCESSED BY BLOOD BANK Performed at [facility], [place] [address] [place], [place] [address]   Urinalysis, Routine w reflex microscopic     Status: Abnormal   Collection Time: 02/23/19  4:31 PM  Result Value Ref Range   Color, Urine YELLOW YELLOW   APPearance CLEAR CLEAR   Specific Gravity, Urine 1.010 1.005 - 1.030   pH 8.0 5.0 - 8.0   Glucose, UA NEGATIVE NEGATIVE mg/dL   Hgb urine dipstick LARGE (A) NEGATIVE   Bilirubin Urine NEGATIVE NEGATIVE   Ketones, ur NEGATIVE NEGATIVE mg/dL   Protein, ur NEGATIVE NEGATIVE mg/dL   Nitrite NEGATIVE NEGATIVE   Leukocytes,Ua NEGATIVE NEGATIVE   RBC / HPF >50 (H) 0 - 5 RBC/hpf   WBC, UA 0-5 0 - 5 WBC/hpf   Bacteria, UA RARE (A) NONE SEEN   Squamous Epithelial / LPF 0-5 0 - 5  Wet prep, genital     Status: Abnormal   Collection Time: 02/23/19  4:31 PM   Specimen: PATH Cytology Cervicovaginal Ancillary Only  Result Value Ref Range   Yeast Wet Prep HPF POC NONE SEEN NONE SEEN   Trich, Wet Prep NONE SEEN NONE SEEN   Clue Cells Wet Prep HPF POC NONE SEEN NONE SEEN   WBC, Wet Prep HPF POC MODERATE (A) NONE SEEN   Sperm NONE SEEN   Prepare RBC     Status: None   Collection Time: 02/23/19  5:10 PM  Result Value Ref Range   Order Confirmation      ORDER PROCESSED BY BLOOD BANK Performed at [facility], [address], [place], [place] [address]     US PELVIC COMPLETE WITH TRANSVAGINAL  Result Date: 02/23/2019 CLINICAL DATA:  Abnormal uterine bleeding for 2 years, worsening, possible fibroids EXAM: TRANSABDOMINAL AND TRANSVAGINAL ULTRASOUND OF PELVIS DOPPLER ULTRASOUND OF OVARIES TECHNIQUE: Both transabdominal and transvaginal ultrasound examinations of the pelvis were performed. Transabdominal  technique was performed for global imaging of the pelvis including uterus, ovaries, adnexal regions, and pelvic cul-de-sac. It was necessary to proceed with endovaginal exam following the transabdominal exam to visualize the uterus and endometrium. Color and duplex Doppler ultrasound was utilized to evaluate blood flow to the ovaries. COMPARISON:  None. FINDINGS: Uterus Measurements: 18.5 x 8.5 x 11.8 cm = volume: 968 mL. There are multiple bulky uterine fibroids, the largest in the uterine fundus measuring 11.2 x 8.2 x 7.1 cm, a second large fibroid in the lower uterine segment measuring 9.1 x 7.5 x 6.8 cm, and a third fibroid in the left aspect of the uterine body measuring 2.9 x 2.5 x 1.9 cm. The largest fibroids likely have significant submucosal components. Endometrium Endometrium is obscured by bulky, shadowing uterine fibroids. Right ovary Measurements: 2.8 x 1.5 x 1.2 cm = volume: 2.5 mL. Normal appearance/no adnexal mass. Left ovary Measurements: 3.0 x 2.2 x 3.2 cm = volume: 11 mL. Normal appearance/no adnexal mass. Pulsed Doppler evaluation of both ovaries demonstrates normal low-resistance arterial and venous waveforms. Other findings Trace free fluid in the pelvis. IMPRESSION: 1. There are multiple bulky uterine fibroids, the largest in the uterine fundus measuring 11.2 x 8.2 x 7.1 cm, a second large fibroid in the lower uterine segment measuring 9.1  x 7.5 x 6.8 cm, and a third fibroid in the left aspect of the uterine body measuring 2.9 x 2.5 x 1.9 cm. 2.  Overall dimensions of the uterus at least 18.5 x 8.5 x 11.8 cm. 3. The endometrium is inadequately visualized due to large bulky fibroids. The largest fibroids likely have significant submucosal components. 4. Trace free fluid in the pelvis, likely functional in the reproductive age setting. Electronically Signed   By: Eddie Candle M.D.   On: 02/23/2019 21:00    Assessment/Plan: 33 yo with symptomatic anemia due to menorrhagia and fibroid  uterus - Admit for blood transfusion - Megace to help control vaginal bleeding - Patient to follow up upon discharge with our office for management of her fibroid and menorrhagia  Brittin Janik 02/23/2019, 9:15 PM

## 2019-02-23 NOTE — Progress Notes (Signed)
Per attending, patient is to be transfused 3 units of PRBC's not 5. Orders will be carried out as instructed and ordered.

## 2019-02-23 NOTE — ED Triage Notes (Signed)
Pt states her LMP ended on NOV.21st however restarted her period this morning at 4am- pt states heavy bleeding with abd pain. Pt states she is using 1 super tampon and a pad about every 90 minutes.

## 2019-02-23 NOTE — ED Notes (Signed)
Dinner tray ordered.

## 2019-02-23 NOTE — Progress Notes (Signed)
Ist transfusion completed without any RXN observed. Patient tolerated it well.

## 2019-02-23 NOTE — Progress Notes (Signed)
Ultrasound contacted about patient's first transfusion completed. Ultrasound Tech Suggested to RN to wait a little bit before starting the rest of the blood transfusion since she is the only one available.

## 2019-02-23 NOTE — ED Notes (Signed)
Pt will go to [place] when first unit of blood in finished, will start second unit when pt returns from [place].

## 2019-02-23 NOTE — ED Notes (Signed)
Main lab to add on RPR

## 2019-02-23 NOTE — ED Provider Notes (Signed)
[facility] Provider Note   CSN: [id number] Arrival date & time: 02/23/19  1228     History Chief Complaint  Patient presents with  . Vaginal Bleeding    Carmen Hays is a 33 y.o. female presenting for evaluation of vaginal bleeding.  Patient states over the past 4 months, her periods have become heavier and longer.  Her last period was November 14-21.  She woke up this morning and what she describes a pool of blood.  Since then, she has had very heavy bleeding, she is bleeding through a super tampon and pad an hour.  She is also reporting lots of lower abdominal cramping and discomfort, she has not taken anything for this at this time.  Normally the only thing that helps is naproxen, she takes 6-8 at a time.  She states she intermittently feels dizzy and weak, mostly when she is standing.  She has fevers, chills, sore throat, cough, chest pain.  She states she had one episode of emesis this morning, is not feeling nauseous currently.  She states when she went to give a urine sample today, she noticed mild burning, but no other urinary symptoms.  She is not on any hormones.  She has never had menstrual cramps or heavy bleeding evaluated by an OB/GYN.  She does not have an OB/GYN currently.  She is not on blood thinners.  She has no other medical problems, takes no medications daily.  HPI     History reviewed. No pertinent past medical history.  Patient Active Problem List   Diagnosis Date Noted  . Symptomatic anemia 02/23/2019    No past surgical history on file.   OB History   No obstetric history on file.     No family history on file.  Social History   Tobacco Use  . Smoking status: Never Smoker  . Smokeless tobacco: Never Used  Substance Use Topics  . Alcohol use: No  . Drug use: No    Home Medications Prior to Admission medications   Medication Sig Start Date End Date Taking? Authorizing Provider  naproxen sodium  (ALEVE) 220 MG tablet Take 1,760 mg by mouth daily as needed (pain/cramps).   Yes [provider]  albuterol (PROVENTIL HFA;VENTOLIN HFA) 108 (90 Base) MCG/ACT inhaler Inhale 2 puffs into the lungs every 4 (four) hours as needed for wheezing or shortness of breath. Patient not taking: Reported on 02/23/2019 05/05/18   Harrie Foreman, MD  dicyclomine (BENTYL) 20 MG tablet Take 1 tablet (20 mg total) by mouth 2 (two) times daily. Patient not taking: Reported on 02/23/2019 03/09/14   Margarita Mail, PA-C  ondansetron (ZOFRAN) 4 MG tablet Take 1 tablet (4 mg total) by mouth every 8 (eight) hours as needed for nausea or vomiting. Patient not taking: Reported on 02/23/2019 03/09/14   Margarita Mail, PA-C  polyethylene glycol powder (GLYCOLAX/MIRALAX) powder Take 6 capfuls in 32 ounces of water before bed. Patient not taking: Reported on 02/23/2019 03/09/14   Margarita Mail, PA-C    Allergies    Amoxicillin  Review of Systems   Review of Systems  Gastrointestinal: Positive for nausea and vomiting (x1).  Genitourinary: Positive for dysuria and vaginal bleeding.  Neurological: Positive for dizziness and light-headedness.  All other systems reviewed and are negative.   Physical Exam Updated Vital Signs BP 114/79   Pulse 89   Temp 98.5 F (36.9 C) (Oral)   Resp 12   LMP 02/23/2019   SpO2  100%   Physical Exam Vitals and nursing note reviewed. Exam conducted with a chaperone present.  Constitutional:      General: She is not in acute distress.    Appearance: She is well-developed.     Comments: Appears nontoxic  HENT:     Head: Normocephalic and atraumatic.  Eyes:     Extraocular Movements: Extraocular movements intact.     Conjunctiva/sclera: Conjunctivae normal.     Pupils: Pupils are equal, round, and reactive to light.  Cardiovascular:     Rate and Rhythm: Normal rate and regular rhythm.     Pulses: Normal pulses.  Pulmonary:     Effort: Pulmonary effort is  normal. No respiratory distress.     Breath sounds: Normal breath sounds. No wheezing.  Abdominal:     General: There is no distension.     Palpations: Abdomen is soft. There is no mass.     Tenderness: There is abdominal tenderness. There is no guarding or rebound.     Comments: ttp of lower abd. Firm mass palpated in lower abd/pelvis, worse on the R side.   Genitourinary:    Cervix: Cervical bleeding present.     Comments: Cervix not visualized due to pelvic mass, moderate to mild bleeding noted during exam.  Large mass palpated. Musculoskeletal:        General: Normal range of motion.     Cervical back: Normal range of motion and neck supple.  Skin:    General: Skin is warm and dry.     Capillary Refill: Capillary refill takes less than 2 seconds.  Neurological:     Mental Status: She is alert and oriented to person, place, and time.     ED Results / Procedures / Treatments   Labs (all labs ordered are listed, but only abnormal results are displayed) Labs Reviewed  WET PREP, GENITAL - Abnormal; Notable for the following components:      Result Value   WBC, Wet Prep HPF POC MODERATE (*)    All other components within normal limits  CBC - Abnormal; Notable for the following components:   Hemoglobin 6.0 (*)    HCT 23.8 (*)    MCV 61.0 (*)    MCH 15.4 (*)    MCHC 25.2 (*)    RDW 22.2 (*)    All other components within normal limits  BASIC METABOLIC PANEL - Abnormal; Notable for the following components:   CO2 21 (*)    BUN <5 (*)    Calcium 8.7 (*)    All other components within normal limits  FOLATE - Abnormal; Notable for the following components:   Folate 5.7 (*)    All other components within normal limits  IRON AND TIBC - Abnormal; Notable for the following components:   Iron 11 (*)    Saturation Ratios 3 (*)    All other components within normal limits  FERRITIN - Abnormal; Notable for the following components:   Ferritin 2 (*)    All other components within  normal limits  RETICULOCYTES - Abnormal; Notable for the following components:   Immature Retic Fract 17.2 (*)    All other components within normal limits  URINALYSIS, ROUTINE W REFLEX MICROSCOPIC - Abnormal; Notable for the following components:   Hgb urine dipstick LARGE (*)    RBC / HPF >50 (*)    Bacteria, UA RARE (*)    All other components within normal limits  SARS CORONAVIRUS 2 (TAT 6-24 HRS)  SARS  CORONAVIRUS 2 (TAT 6-24 HRS)  VITAMIN B12  RPR  CBC  I-STAT BETA HCG BLOOD, ED ([facility], [facility], [facility] ONLY)  TYPE AND SCREEN  PREPARE RBC (CROSSMATCH)  ABO/RH  PREPARE RBC (CROSSMATCH)  GC/CHLAMYDIA PROBE AMP ([place]) NOT AT [facility]    EKG None  Radiology No results found.  Procedures .Critical Care Performed by: Franchot Heidelberg, PA-C Authorized by: Franchot Heidelberg, PA-C   Critical care provider statement:    Critical care time (minutes):  45   Critical care time was exclusive of:  Separately billable procedures and treating other patients and teaching time   Critical care was necessary to treat or prevent imminent or life-threatening deterioration of the following conditions:  CNS failure or compromise   Critical care was time spent personally by me on the following activities:  Development of treatment plan with patient or surrogate, blood draw for specimens, evaluation of patient's response to treatment, examination of patient, obtaining history from patient or surrogate, ordering and performing treatments and interventions, ordering and review of laboratory studies, ordering and review of radiographic studies, pulse oximetry, re-evaluation of patient's condition and review of old charts   I assumed direction of critical care for this patient from another provider in my specialty: no   Comments:     Pt with critically low hgb. Blood transfusion started and pt admitted.    (including critical care time)  Medications Ordered in ED Medications  0.9 %  sodium chloride  infusion (Manually program via Guardrails IV Fluids) ( Intravenous Not Given 02/23/19 1710)  prenatal multivitamin tablet 1 tablet (has no administration in time range)  acetaminophen (TYLENOL) tablet 650 mg (has no administration in time range)  diphenhydrAMINE (BENADRYL) capsule 25 mg (has no administration in time range)  0.9 %  sodium chloride infusion (Manually program via Guardrails IV Fluids) ( Intravenous New Bag/Given 02/23/19 1710)    ED Course  I have reviewed the triage vital signs and the nursing notes.  Pertinent labs & imaging results that were available during my care of the patient were reviewed by me and considered in my medical decision making (see chart for details).  Clinical Course as of Feb 23 1727  Fri Feb 23, 2019  1649 Patient was seen by myself as well as PA provider.  Briefly is 33 year old female with history of heavy menstrual bleeding presents to the ED with symptomatic anemia and persistent vaginal bleeding.  She says been going on for several months.  She has irregular menstrual periods and when she is on her period, is having extremely heavy bleeding, soaking through 1 large pad to 2 large pads an hour.  She currently feels short of breath and lightheaded.  She is still continued to have active spotting and bleeding although it is less than before.  On exam the patient appears fairly comfortable in the bed.  She does have some conjunctival pallor.  She has a firm mass palpable in the lower abdomen, suspicious for uterine fibroid or other uterine mass, which she says she has noticed for maybe 4 to 5 months.  She has never had imaging before and does not have an OB/GYN doctor here.  Of note, the patient also takes large doses of naproxen, 6-8 tablets of 220 mg at a time, once daily, for her bleeding.  She says the only thing that helps with her cramping.  Overall I have a lower suspicion for GI bleed and believe that she is symptomatic from her persistent vaginal  bleeding.  We have consented her verbally for blood transfusion and will order her blood.  She will need to be admitted for further evaluation.  The patient and her mother at bedside are in agreement with this plan.   [MT]    Clinical Course User Index [MT] Trifan, Carola Rhine, MD   MDM Rules/Calculators/A&P     CHA2DS2/VAS Stroke Risk Points      N/A >= 2 Points: High Risk  1 - 1.99 Points: Medium Risk  0 Points: Low Risk    A final score could not be computed because of missing components.: Last  Change: N/A     This score determines the patient's risk of having a stroke if the  patient has atrial fibrillation.      This score is not applicable to this patient. Components are not  calculated.                   Patient presenting for evaluation of vaginal bleeding, dizziness/weakness, lower abdominal pain.  Physical exam shows patient appears nontoxic.  Blood pressure and heart rate are stable.  She does have vaginal bleeding noted on rectal exam, as well as a pelvic mass.  Likely fibroid considering patient's age.  Blood work obtained from triage shows anemia at 6.  Otherwise reassuring.  Will start blood transfusion and admit to OB/GYN.  Discussed with Dr. Roland Rack from oby/gyn, who will admit the pt.   Final Clinical Impression(s) / ED Diagnoses Final diagnoses:  Vaginal bleeding  Pelvic mass  Symptomatic anemia  Fibroid    Rx / DC Orders ED Discharge Orders    None       Franchot Heidelberg, PA-C 02/23/19 1735    Wyvonnia Dusky, MD 02/23/19 2154

## 2019-02-24 ENCOUNTER — Encounter (HOSPITAL_COMMUNITY): Payer: Self-pay | Admitting: Obstetrics and Gynecology

## 2019-02-24 DIAGNOSIS — N939 Abnormal uterine and vaginal bleeding, unspecified: Secondary | ICD-10-CM

## 2019-02-24 DIAGNOSIS — R1900 Intra-abdominal and pelvic swelling, mass and lump, unspecified site: Secondary | ICD-10-CM

## 2019-02-24 DIAGNOSIS — D219 Benign neoplasm of connective and other soft tissue, unspecified: Secondary | ICD-10-CM

## 2019-02-24 DIAGNOSIS — N920 Excessive and frequent menstruation with regular cycle: Secondary | ICD-10-CM

## 2019-02-24 DIAGNOSIS — D259 Leiomyoma of uterus, unspecified: Secondary | ICD-10-CM

## 2019-02-24 LAB — CBC
HCT: 28.7 % — ABNORMAL LOW (ref 36.0–46.0)
Hemoglobin: 8.4 g/dL — ABNORMAL LOW (ref 12.0–15.0)
MCH: 20 pg — ABNORMAL LOW (ref 26.0–34.0)
MCHC: 29.3 g/dL — ABNORMAL LOW (ref 30.0–36.0)
MCV: 68.5 fL — ABNORMAL LOW (ref 80.0–100.0)
Platelets: 275 10*3/uL (ref 150–400)
RBC: 4.19 MIL/uL (ref 3.87–5.11)
RDW: 28.1 % — ABNORMAL HIGH (ref 11.5–15.5)
WBC: 5 10*3/uL (ref 4.0–10.5)
nRBC: 0 % (ref 0.0–0.2)

## 2019-02-24 LAB — RPR: RPR Ser Ql: NONREACTIVE

## 2019-02-24 MED ORDER — MEGESTROL ACETATE 40 MG PO TABS: 40.0000 mg | ORAL_TABLET | Freq: Two times a day (BID) | ORAL | 3 refills | Status: DC

## 2019-02-24 MED ORDER — FERROUS SULFATE 325 (65 FE) MG PO TABS: 325.0000 mg | ORAL_TABLET | Freq: Two times a day (BID) | ORAL | 1 refills | Status: DC

## 2019-02-24 NOTE — Progress Notes (Signed)
Discharge instructions reviewed with patient. All questions answered at this time.  Patient requests to speak with SW/CM with insurance assistance. Transition care team page.   Awaiting for transportation from family.  Ave Filter, RN

## 2019-02-24 NOTE — Discharge Instructions (Signed)
Myomectomy    Myomectomy is a surgery in which a non-cancerous fibroid (myoma) is removed from the uterus. Myomas are tumors made up of fibrous tissue. They are often called fibroid tumors. Fibroid tumors can range from the size of a pea to the size of a grapefruit. In a myomectomy, the fibroid tumor is removed without removing the uterus. Because these tumors are rarely cancerous, this surgery is usually done only if the tumor is growing or causing symptoms such as pain, pressure, bleeding, or pain with intercourse.  Tell a health care provider about:  · Any allergies you have.  · All medicines you are taking, including vitamins, herbs, eye drops, creams, and over-the-counter medicines.  · Any problems you or family members have had with anesthetic medicines.  · Any blood disorders you have.  · Any surgeries you have had.  · Any medical conditions you have.  What are the risks?  Generally, this is a safe procedure. However, problems may occur, including:  · Bleeding.  · Infection.  · Allergic reactions to medicines.  · Damage to other structures or organs.  · Blood clots in the legs, chest, or brain.  · Scar tissue on other organs and in the pelvis. This may require another surgery to remove the scar tissue.  What happens before the procedure?  Staying hydrated  Follow instructions from your health care provider about hydration, which may include:  · Up to 2 hours before the procedure - you may continue to drink clear liquids, such as water, clear fruit juice, black coffee, and plain tea.  Eating and drinking restrictions  Follow instructions from your health care provider about eating and drinking, which may include:  · 8 hours before the procedure - stop eating heavy meals or foods such as meat, fried foods, or fatty foods.  · 6 hours before the procedure - stop eating light meals or foods, such as toast or cereal.  · 6 hours before the procedure - stop drinking milk or drinks that contain milk.  · 2 hours before  the procedure - stop drinking clear liquids.  General instructions  · Ask your health care provider about:  ? Changing or stopping your regular medicines. This is especially important if you are taking diabetes medicines or blood thinners.  ? Taking medicines such as aspirin and ibuprofen. These medicines can thin your blood. Do not take these medicines before your procedure if your health care provider instructs you not to.  · Do not drink alcohol the day before the surgery.  · Do not use any products that contain nicotine or tobacco, such as cigarettes and e-cigarettes, for 2 weeks before the procedure. If you need help quitting, ask your health care provider.  · Plan to have someone take you home from the hospital or clinic. Also arrange for someone to help you with activities during your recovery.  What happens during the procedure?  · To reduce your risk of infection:  ? Your health care team will wash or sanitize their hands.  ? Your skin will be washed with soap.  ? Hair may be removed from the surgical area.  · An IV tube will be inserted into one of your veins. Medicines will be able to flow directly into your body through this IV tube.  · You will be given one or more of the following:  ? A medicine to help you relax (sedative).  ? A medicine to make you fall asleep (general anesthetic).  ·   into your bladder to collect urine.  Your surgeon will use one of the following methods to perform the procedure. The method used will depend on the size, shape, location, and number of fibroids. Hysteroscopic myomectomy This method may be used when the fibroid tumor is inside the cavity of the uterus. A long, thin tube with a lens (hysteroscope) will be inserted into the  uterus through the vagina. A saline solution will be put into the uterus. This will expand the uterus and allow the surgeon to see the fibroids. Tools will be passed through the hysteroscope to remove the fibroid tumor in pieces. Laparoscopic myomectomy A few small incisions will be made in the lower abdomen. A thin, lighted tube with a camera (laparoscope) will be inserted through one of the incisions. This will give the surgeon a good view of the area. The fibroid tumor will be removed through the other incisions. The incisions will then be closed with stitches (sutures) or staples. Abdominal myomectomy This method is used when the fibroid tumor cannot be removed with a hysteroscope or laparoscope. The surgery will be done through a larger surgical incision in the abdomen. The fibroid tumor will be removed through this incision. The incision will be closed with sutures or staples. Recovery time will be longer if this method is used. The procedures may vary among health care providers and hospitals. What happens after the procedure?  Your blood pressure, heart rate, breathing rate, and blood oxygen level will be monitored until the medicines you were given have worn off.  The IV access tube and catheter will remain on your body for a period of time.  You may be given medicine for pain or to help you sleep.  You may be given an antibiotic medicine if needed.  Do not drive for 24 hours if you were given a sedative. Summary  Myomectomy is surgery to remove a noncancerous fibroid (myoma) from the uterus.  This surgery is usually done only if the tumor is growing or causing symptoms such as pain, pressure, bleeding, or pain during intercourse.  Follow instructions from your health care provider about eating and drinking before the procedure.  Recovery time from this procedure depends on the method. The abdominal method will require a longer recovery time. This information is not intended to  replace advice given to you by your health care provider. Make sure you discuss any questions you have with your health care provider. Document Released: 12/27/2006 Document Revised: 06/23/2018 Document Reviewed: 04/01/2016 Elsevier Patient Education  2020 [employer]. Uterine Fibroids  Uterine fibroids (leiomyomas) are noncancerous (benign) tumors that can develop in the uterus. Fibroids may also develop in the fallopian tubes, cervix, or tissues (ligaments) near the uterus. You may have one or many fibroids. Fibroids vary in size, weight, and where they grow in the uterus. Some can become quite large. Most fibroids do not require medical treatment. What are the causes? The cause of this condition is not known. What increases the risk? You are more likely to develop this condition if you:  Are in your 30s or 40s and have not gone through menopause.  Have a family history of this condition.  Are of African-American descent.  Had your first period at an early age (early menarche).  Have not had any children (nulliparity).  Are overweight or obese. What are the signs or symptoms? Many women do not have any symptoms. Symptoms of this condition may include:  Heavy menstrual bleeding.  Bleeding or spotting  between periods.  Pain and pressure in the pelvic area, between the hips.  Bladder problems, such as needing to urinate urgently or more often than usual.  Inability to have children (infertility).  Failure to carry pregnancy to term (miscarriage). How is this diagnosed? This condition may be diagnosed based on:  Your symptoms and medical history.  A physical exam.  A pelvic exam that includes feeling for any tumors.  Imaging tests, such as ultrasound or MRI. How is this treated? Treatment for this condition may include:  Seeing your health care provider for follow-up visits to monitor your fibroids for any changes.  Taking NSAIDs such as ibuprofen, naproxen, or  aspirin to reduce pain.  Hormone medicines. These may be taken as a pill, given in an injection, or delivered by a T-shaped device that is inserted into the uterus (intrauterine device, IUD).  Surgery to remove one of the following: ? The fibroids (myomectomy). Your health care provider may recommend this if fibroids affect your fertility and you want to become pregnant. ? The uterus (hysterectomy). ? Blood supply to the fibroids (uterine artery embolization). Follow these instructions at home:  Take over-the-counter and prescription medicines only as told by your health care provider.  Ask your health care provider if you should take iron pills or eat more iron-rich foods, such as dark green, leafy vegetables. Heavy menstrual bleeding can cause low iron levels.  If directed, apply heat to your back or abdomen to reduce pain. Use the heat source that your health care provider recommends, such as a moist heat pack or a heating pad. ? Place a towel between your skin and the heat source. ? Leave the heat on for 20-30 minutes. ? Remove the heat if your skin turns bright red. This is especially important if you are unable to feel pain, heat, or cold. You may have a greater risk of getting burned.  Pay close attention to your menstrual cycle. Tell your health care provider about any changes, such as: ? Increased blood flow that requires you to use more pads or tampons than usual. ? A change in the number of days that your period lasts. ? A change in symptoms that are associated with your period, such as back pain or cramps in your abdomen.  Keep all follow-up visits as told by your health care provider. This is important, especially if your fibroids need to be monitored for any changes. Contact a health care provider if you:  Have pelvic pain, back pain, or cramps in your abdomen that do not get better with medicine or heat.  Develop new bleeding between periods.  Have increased bleeding  during or between periods.  Feel unusually tired or weak.  Feel light-headed. Get help right away if you:  Faint.  Have pelvic pain that suddenly gets worse.  Have severe vaginal bleeding that soaks a tampon or pad in 30 minutes or less. Summary  Uterine fibroids are noncancerous (benign) tumors that can develop in the uterus.  The exact cause of this condition is not known.  Most fibroids do not require medical treatment unless they affect your ability to have children (fertility).  Contact a health care provider if you have pelvic pain, back pain, or cramps in your abdomen that do not get better with medicines.  Make sure you know what symptoms should cause you to get help right away. This information is not intended to replace advice given to you by your health care provider. Make sure you  discuss any questions you have with your health care provider. Document Released: 02/27/2000 Document Revised: 02/11/2017 Document Reviewed: 01/25/2017 Elsevier Patient Education  2020 [employer].

## 2019-02-24 NOTE — Discharge Summary (Signed)
Physician Discharge Summary  Patient ID: Carmen Hays MRN: [id number] DOB/AGE: December 06, 1985 33 y.o.  Admit date: 02/23/2019 Discharge date: 02/24/2019   Discharge Diagnoses:  Active Problems:   Acute blood loss anemia   Fibroid uterus   Menorrhagia   Consults: None  Significant Diagnostic Studies:  CBC Latest Ref Rng & Units 02/24/2019 02/23/2019 03/09/2014  WBC 4.0 - 10.5 K/uL 5.0 6.0 5.6  Hemoglobin 12.0 - 15.0 g/dL 8.4(L) 6.0(LL) 10.0(L)  Hematocrit 36.0 - 46.0 % 28.7(L) 23.8(L) 32.0(L)  Platelets 150 - 400 K/uL 275 353 551(H)    US PELVIC COMPLETE WITH TRANSVAGINAL  Result Date: 02/23/2019 CLINICAL DATA:  Abnormal uterine bleeding for 2 years, worsening, possible fibroids EXAM: TRANSABDOMINAL AND TRANSVAGINAL ULTRASOUND OF PELVIS DOPPLER ULTRASOUND OF OVARIES TECHNIQUE: Both transabdominal and transvaginal ultrasound examinations of the pelvis were performed. Transabdominal technique was performed for global imaging of the pelvis including uterus, ovaries, adnexal regions, and pelvic cul-de-sac. It was necessary to proceed with endovaginal exam following the transabdominal exam to visualize the uterus and endometrium. Color and duplex Doppler ultrasound was utilized to evaluate blood flow to the ovaries. COMPARISON:  None. FINDINGS: Uterus Measurements: 18.5 x 8.5 x 11.8 cm = volume: 968 mL. There are multiple bulky uterine fibroids, the largest in the uterine fundus measuring 11.2 x 8.2 x 7.1 cm, a second large fibroid in the lower uterine segment measuring 9.1 x 7.5 x 6.8 cm, and a third fibroid in the left aspect of the uterine body measuring 2.9 x 2.5 x 1.9 cm. The largest fibroids likely have significant submucosal components. Endometrium Endometrium is obscured by bulky, shadowing uterine fibroids. Right ovary Measurements: 2.8 x 1.5 x 1.2 cm = volume: 2.5 mL. Normal appearance/no adnexal mass. Left ovary Measurements: 3.0 x 2.2 x 3.2 cm = volume: 11 mL. Normal appearance/no  adnexal mass. Pulsed Doppler evaluation of both ovaries demonstrates normal low-resistance arterial and venous waveforms. Other findings Trace free fluid in the pelvis. IMPRESSION: 1. There are multiple bulky uterine fibroids, the largest in the uterine fundus measuring 11.2 x 8.2 x 7.1 cm, a second large fibroid in the lower uterine segment measuring 9.1 x 7.5 x 6.8 cm, and a third fibroid in the left aspect of the uterine body measuring 2.9 x 2.5 x 1.9 cm. 2.  Overall dimensions of the uterus at least 18.5 x 8.5 x 11.8 cm. 3. The endometrium is inadequately visualized due to large bulky fibroids. The largest fibroids likely have significant submucosal components. 4. Trace free fluid in the pelvis, likely functional in the reproductive age setting. Electronically Signed   By: Eddie Candle M.D.   On: 02/23/2019 21:00     Hospital Course: patient admitted with heavy vaginal bleeding and acute blood loss anemia. She was transfused 3 u with appropriate rise in Hgb. She was started on Megace and noted her bleeding improved somewhat. She underwent ultrasound and noted to have several large fibroids. She was educated on fibroids. She has never been pregnant. Discussed options of DepoLupron or myomectomy. She will consider options for outpatient follow-up. She is stable for discharge.  Discharge Exam: Blood pressure 105/63, pulse 71, temperature 98.5 F (36.9 C), temperature source Oral, resp. rate 18, last menstrual period 02/23/2019, SpO2 100 %. Physical Examination: General appearance - alert, well appearing, and in no distress Chest - normal effort Heart - normal rate and regular rhythm Abdomen - soft, mild tenderness, 18 wk size mass in lower abdomen Musculoskeletal - no joint tenderness, deformity or swelling Extremities -  Homan's sign negative bilaterally  Disposition: Discharge disposition: 01-Home or Self Care       Discharged Condition: improved  Discharge Instructions    Call MD for:    Complete by: As directed    Severe worsening bleeding   Call MD for:  difficulty breathing, headache or visual disturbances   Complete by: As directed    Call MD for:  extreme fatigue   Complete by: As directed    Call MD for:  persistant nausea and vomiting   Complete by: As directed    Call MD for:  severe uncontrolled pain   Complete by: As directed    Call MD for:  temperature >100.4   Complete by: As directed    Diet - low sodium heart healthy   Complete by: As directed    Increase activity slowly   Complete by: As directed    No wound care   Complete by: As directed      Allergies as of 02/24/2019      Reactions   Amoxicillin Rash   Vaginal swelling id it involve swelling of the face/tongue/throat, SOB, or low BP? No Did it involve sudden or severe rash/hives, skin peeling, or any reaction on the inside of your mouth or nose? Yes Did you need to seek medical attention at a hospital or doctor's office? No When did it last happen?33 yrs old If all above answers are "NO", may proceed with cephalosporin use.      Medication List    STOP taking these medications   dicyclomine 20 MG tablet Commonly known as: BENTYL   ondansetron 4 MG tablet Commonly known as: Zofran   polyethylene glycol powder 17 GM/SCOOP powder Commonly known as: GLYCOLAX/MIRALAX     TAKE these medications   albuterol 108 (90 Base) MCG/ACT inhaler Commonly known as: VENTOLIN HFA Inhale 2 puffs into the lungs every 4 (four) hours as needed for wheezing or shortness of breath.   ferrous sulfate 325 (65 FE) MG tablet Commonly known as: FerrouSul Take 1 tablet (325 mg total) by mouth 2 (two) times daily.   megestrol 40 MG tablet Commonly known as: MEGACE Take 1 tablet (40 mg total) by mouth 2 (two) times daily.   naproxen sodium 220 MG tablet Commonly known as: ALEVE Take 1,760 mg by mouth daily as needed (pain/cramps).      Follow-up [facility] Follow up in 2 week(s).   Specialty: Obstetrics and Gynecology Why: They will callyou to schedule Contact information: [address] [place], [place] [id number] [place] [phone]          Signed: Donnamae Jude 02/24/2019, 11:00 AM

## 2019-02-25 LAB — TYPE AND SCREEN
ABO/RH(D): B POS
Antibody Screen: NEGATIVE
Unit division: 0
Unit division: 0
Unit division: 0

## 2019-02-25 LAB — BPAM RBC
Blood Product Expiration Date: 202101102359
Blood Product Expiration Date: 202101122359
Blood Product Expiration Date: 202101132359
ISSUE DATE / TIME: 202012111745
ISSUE DATE / TIME: 202012112121
ISSUE DATE / TIME: 202012120049
Unit Type and Rh: 7300
Unit Type and Rh: 7300
Unit Type and Rh: 7300

## 2019-02-27 LAB — GC/CHLAMYDIA PROBE AMP (~~LOC~~) NOT AT ARMC
Chlamydia: NEGATIVE
Neisseria Gonorrhea: NEGATIVE

## 2019-03-20 ENCOUNTER — Encounter: Payer: Self-pay | Admitting: Obstetrics and Gynecology

## 2019-03-22 ENCOUNTER — Encounter: Payer: Self-pay | Admitting: Obstetrics & Gynecology

## 2019-03-26 ENCOUNTER — Other Ambulatory Visit: Payer: Self-pay

## 2019-03-26 ENCOUNTER — Encounter: Payer: Self-pay | Admitting: Obstetrics & Gynecology

## 2019-03-26 ENCOUNTER — Ambulatory Visit (INDEPENDENT_AMBULATORY_CARE_PROVIDER_SITE_OTHER): Payer: Self-pay | Admitting: Obstetrics & Gynecology

## 2019-03-26 VITALS — BP 106/65 | HR 86 | Ht 65.0 in | Wt 163.0 lb

## 2019-03-26 DIAGNOSIS — D500 Iron deficiency anemia secondary to blood loss (chronic): Secondary | ICD-10-CM

## 2019-03-26 DIAGNOSIS — D250 Submucous leiomyoma of uterus: Secondary | ICD-10-CM

## 2019-03-26 DIAGNOSIS — N939 Abnormal uterine and vaginal bleeding, unspecified: Secondary | ICD-10-CM

## 2019-03-26 MED ORDER — MEGESTROL ACETATE 40 MG PO TABS: 80.0000 mg | ORAL_TABLET | Freq: Two times a day (BID) | ORAL | 5 refills | Status: DC

## 2019-03-26 MED ORDER — INTEGRA F 125-1 MG PO CAPS: 1.0000 | ORAL_CAPSULE | Freq: Every day | ORAL | 3 refills | Status: DC

## 2019-03-26 MED ORDER — TRANEXAMIC ACID 650 MG PO TABS: 1300.0000 mg | ORAL_TABLET | Freq: Three times a day (TID) | ORAL | 2 refills | Status: DC

## 2019-03-26 NOTE — Patient Instructions (Signed)
Myomectomy    Myomectomy is a surgery in which a non-cancerous fibroid (myoma) is removed from the uterus. Myomas are tumors made up of fibrous tissue. They are often called fibroid tumors. Fibroid tumors can range from the size of a pea to the size of a grapefruit. In a myomectomy, the fibroid tumor is removed without removing the uterus. Because these tumors are rarely cancerous, this surgery is usually done only if the tumor is growing or causing symptoms such as pain, pressure, bleeding, or pain with intercourse.  Tell a health care provider about:  · Any allergies you have.  · All medicines you are taking, including vitamins, herbs, eye drops, creams, and over-the-counter medicines.  · Any problems you or family members have had with anesthetic medicines.  · Any blood disorders you have.  · Any surgeries you have had.  · Any medical conditions you have.  What are the risks?  Generally, this is a safe procedure. However, problems may occur, including:  · Bleeding.  · Infection.  · Allergic reactions to medicines.  · Damage to other structures or organs.  · Blood clots in the legs, chest, or brain.  · Scar tissue on other organs and in the pelvis. This may require another surgery to remove the scar tissue.  What happens before the procedure?  Staying hydrated  Follow instructions from your health care provider about hydration, which may include:  · Up to 2 hours before the procedure - you may continue to drink clear liquids, such as water, clear fruit juice, black coffee, and plain tea.  Eating and drinking restrictions  Follow instructions from your health care provider about eating and drinking, which may include:  · 8 hours before the procedure - stop eating heavy meals or foods such as meat, fried foods, or fatty foods.  · 6 hours before the procedure - stop eating light meals or foods, such as toast or cereal.  · 6 hours before the procedure - stop drinking milk or drinks that contain milk.  · 2 hours before  the procedure - stop drinking clear liquids.  General instructions  · Ask your health care provider about:  ? Changing or stopping your regular medicines. This is especially important if you are taking diabetes medicines or blood thinners.  ? Taking medicines such as aspirin and ibuprofen. These medicines can thin your blood. Do not take these medicines before your procedure if your health care provider instructs you not to.  · Do not drink alcohol the day before the surgery.  · Do not use any products that contain nicotine or tobacco, such as cigarettes and e-cigarettes, for 2 weeks before the procedure. If you need help quitting, ask your health care provider.  · Plan to have someone take you home from the hospital or clinic. Also arrange for someone to help you with activities during your recovery.  What happens during the procedure?  · To reduce your risk of infection:  ? Your health care team will wash or sanitize their hands.  ? Your skin will be washed with soap.  ? Hair may be removed from the surgical area.  · An IV tube will be inserted into one of your veins. Medicines will be able to flow directly into your body through this IV tube.  · You will be given one or more of the following:  ? A medicine to help you relax (sedative).  ? A medicine to make you fall asleep (general anesthetic).  ·   into your bladder to collect urine.  Your surgeon will use one of the following methods to perform the procedure. The method used will depend on the size, shape, location, and number of fibroids. Hysteroscopic myomectomy This method may be used when the fibroid tumor is inside the cavity of the uterus. A long, thin tube with a lens (hysteroscope) will be inserted into the  uterus through the vagina. A saline solution will be put into the uterus. This will expand the uterus and allow the surgeon to see the fibroids. Tools will be passed through the hysteroscope to remove the fibroid tumor in pieces. Laparoscopic myomectomy A few small incisions will be made in the lower abdomen. A thin, lighted tube with a camera (laparoscope) will be inserted through one of the incisions. This will give the surgeon a good view of the area. The fibroid tumor will be removed through the other incisions. The incisions will then be closed with stitches (sutures) or staples. Abdominal myomectomy This method is used when the fibroid tumor cannot be removed with a hysteroscope or laparoscope. The surgery will be done through a larger surgical incision in the abdomen. The fibroid tumor will be removed through this incision. The incision will be closed with sutures or staples. Recovery time will be longer if this method is used. The procedures may vary among health care providers and hospitals. What happens after the procedure?  Your blood pressure, heart rate, breathing rate, and blood oxygen level will be monitored until the medicines you were given have worn off.  The IV access tube and catheter will remain on your body for a period of time.  You may be given medicine for pain or to help you sleep.  You may be given an antibiotic medicine if needed.  Do not drive for 24 hours if you were given a sedative. Summary  Myomectomy is surgery to remove a noncancerous fibroid (myoma) from the uterus.  This surgery is usually done only if the tumor is growing or causing symptoms such as pain, pressure, bleeding, or pain during intercourse.  Follow instructions from your health care provider about eating and drinking before the procedure.  Recovery time from this procedure depends on the method. The abdominal method will require a longer recovery time. This information is not intended to  replace advice given to you by your health care provider. Make sure you discuss any questions you have with your health care provider. Document Revised: 06/23/2018 Document Reviewed: 04/01/2016 Elsevier Patient Education  2020 [employer] Myomectomy, Care After This sheet gives you information about how to care for yourself after your procedure. Your health care provider may also give you more specific instructions. If you have problems or questions, contact your health care provider. What can I expect after the procedure? After the procedure, it is common to have:  Pain in your abdomen, especially at the incision areas. You will be given pain medicine to control the pain.  Tiredness. This is a normal part of the recovery process. Your energy level will return to normal over the coming weeks.  Vaginal bleeding. This is normal and will stop in the coming weeks.  Constipation. Recovery time from this procedure will depend on the type of procedure you had and your general overall health prior to the procedure. Follow these instructions at home: Medicines  Take over-the-counter and prescription medicines only as told by your health care provider.  Do not take aspirin because it can cause bleeding.  If you   were prescribed an antibiotic medicine, use it as told by your health care provider. Do not stop using the antibiotic even if you start to feel better.  Do not drive or use heavy machinery while taking prescription pain medicine.  Do not drink alcohol while taking prescription pain medicine. Incision care   Follow instructions from your health care provider about how to take care of any incisions. Make sure you: ? Wash your hands with soap and water before you change your bandage (dressing). If soap and water are not available, use hand sanitizer. ? Change your dressing as told by your health care provider. ? Leave stitches (sutures), skin glue, or adhesive strips in place. These  skin closures may need to stay in place for 2 weeks or longer. If adhesive strip edges start to loosen and curl up, you may trim the loose edges. Do not remove adhesive strips completely unless your health care provider tells you to do that.  Check your incision areas every day for signs of infection. Check for: ? Redness, swelling, or pain. ? Fluid or blood. ? Warmth. ? Pus or a bad smell.  Do not take baths, swim, or use a hot tub until your health care provider approves. Take showers as directed by your health care provider. Activity  Return to your normal activities as told by your health care provider. Ask your health care provider what activities are safe for you.  Do not do activities that require a lot of effort until your health care provider says it is okay.  Do not lift anything that is heavier than 15 lb (6.8 kg) until your health care provider says that it is safe.  Do not douche, use tampons, or have sexual intercourse until your health care provider approves.  Walk daily but take frequent rest breaks if you tire easily.  Continue to practice deep breathing and coughing. If it hurts to cough, try holding a pillow against your belly as you cough.  Do not drive until your health care provider approves. General instructions  To prevent or treat constipation while you are taking prescription pain medicine, your health care provider may recommend that you: ? Drink enough fluid to keep your urine clear or pale yellow. ? Take over-the-counter or prescription medicines. ? Eat foods that are high in fiber, such as fresh fruits and vegetables, whole grains, and beans. ? Limit foods that are high in fat and processed sugars, such as fried and sweet foods.  Take your temperature twice a day and write it down. If you develop a fever, this may be a sign that you have an infection.  Do not drink alcohol.  Have someone help you at home for 1 week or until you can do your own  household activities.  Keep all follow-up visits as told by your health care provider. This is important. Contact a health care provider if:  You have a fever.  You have increasing abdominal pain that is not relieved with medicine.  You have nausea, vomiting, or diarrhea.  You have pain when you urinate or you have blood in your urine.  You have a rash on your body.  You have pain or redness where your IV access tube was inserted.  You have redness, swelling, or pain around an incision.  You have fluid or blood coming from an incision.  An incision feels warm to the touch.  You have pus or a bad smell coming from an incision. Get help right   away if:  You have weakness or light-headedness.  You have pain, swelling, or redness in your legs.  You have chest pain.  You faint.  You have shortness of breath.  You have heavy vaginal bleeding.  You have an incision that is opening up. Summary  Recovery time from this procedure will depend on the type of procedure you had and your general overall health prior to the procedure.  If you were prescribed an antibiotic medicine, use it as told by your health care provider. Do not stop using the antibiotic even if you start to feel better.  Do not douche, use tampons, or have sexual intercourse until your health care provider approves.  Return to your normal activities as told by your health care provider. Ask your health care provider what activities are safe for you. This information is not intended to replace advice given to you by your health care provider. Make sure you discuss any questions you have with your health care provider. Document Revised: 02/11/2017 Document Reviewed: 04/01/2016 Elsevier Patient Education  2020 [employer] Inc[employer]  

## 2019-03-26 NOTE — Progress Notes (Signed)
Subjective:     Carmen Hays is a 34 y.o. female here for a routine exam.  G0 Current complaints: heavy menstrual bleeding. Pt is engaged and desires to  Have children. Neither she nor her fiance have children. She was [facility] 1 month prev with menorrhagia and anemia. She is s/p transfusion of PRBCs. She reports daily bleeding since that time. Pt reports that her bleeding has been getting heavier over several year. She thought this was normal. She reports using a pad and tampon at the same time her cycles are normally 7 days max. Dec was unusual for her as her cycle did not slow down or stop after 7 days.  Pt reports starting to feel weak again. No SOB or DOE. She has not been taking the Iron due to GI upset.    Gynecologic History Patient's last menstrual period was 01/26/2019. Contraception: none Last Pap: 2 years prev in [place] Last mammogram: n/a  Obstetric History OB History  Gravida Para Term Preterm AB Living  0 0 0 0 0 0  SAB TAB Ectopic Multiple Live Births  0 0 0 0 0   The following portions of the patient's history were reviewed and updated as appropriate: allergies, current medications, past family history, past medical history, past social history, past surgical history and problem list.  Review of Systems Pertinent items are noted in HPI.    Objective:  BP 106/65   Pulse 86   Ht 5\' 5"  (1.651 m)   Wt 163 lb (73.9 kg)   LMP 01/26/2019 Comment: pt has had daily bleeding since this date  BMI 27.12 kg/m   CONSTITUTIONAL: Well-developed, well-nourished female in no acute distress.  HENT:  Normocephalic, atraumatic EYES: Conjunctivae and EOM are normal. No scleral icterus.  NECK: Normal range of motion SKIN: Skin is warm and dry. No rash noted. Not diaphoretic.No pallor. [place]: Alert and oriented to person, place, and time. Normal coordination.  Abd: soft, NT, ND. Pelvic mass felt to umbilicus.  GU: EGBUS: no lesions Vagina: no blood in vault; the  fibroids are felt below very low in the pelvis making it difficult for a digital exam  Cervix: no lesion; no mucopurulent d/c Uterus: 18-20 weeks sized; mobile with a wide base.   Adnexa: unable to assess digitally due to uterine size.   02/23/2019 CLINICAL DATA:  Abnormal uterine bleeding for 2 years, worsening, possible fibroids  EXAM: TRANSABDOMINAL AND TRANSVAGINAL ULTRASOUND OF PELVIS  DOPPLER ULTRASOUND OF OVARIES  TECHNIQUE: Both transabdominal and transvaginal ultrasound examinations of the pelvis were performed. Transabdominal technique was performed for global imaging of the pelvis including uterus, ovaries, adnexal regions, and pelvic cul-de-sac.  It was necessary to proceed with endovaginal exam following the transabdominal exam to visualize the uterus and endometrium. Color and duplex Doppler ultrasound was utilized to evaluate blood flow to the ovaries.  COMPARISON:  None.  FINDINGS: Uterus  Measurements: 18.5 x 8.5 x 11.8 cm = volume: 968 mL. There are multiple bulky uterine fibroids, the largest in the uterine fundus measuring 11.2 x 8.2 x 7.1 cm, a second large fibroid in the lower uterine segment measuring 9.1 x 7.5 x 6.8 cm, and a third fibroid in the left aspect of the uterine body measuring 2.9 x 2.5 x 1.9 cm. The largest fibroids likely have significant submucosal components.  Endometrium  Endometrium is obscured by bulky, shadowing uterine fibroids.  Right ovary  Measurements: 2.8 x 1.5 x 1.2 cm = volume: 2.5 mL. Normal  appearance/no adnexal mass.  Left ovary  Measurements: 3.0 x 2.2 x 3.2 cm = volume: 11 mL. Normal appearance/no adnexal mass.  Pulsed Doppler evaluation of both ovaries demonstrates normal low-resistance arterial and venous waveforms.  Other findings  Trace free fluid in the pelvis.  IMPRESSION: 1. There are multiple bulky uterine fibroids, the largest in the uterine fundus measuring 11.2 x 8.2 x 7.1  cm, a second large fibroid in the lower uterine segment measuring 9.1 x 7.5 x 6.8 cm, and a third fibroid in the left aspect of the uterine body measuring 2.9 x 2.5 x 1.9 cm.  2.  Overall dimensions of the uterus at least 18.5 x 8.5 x 11.8 cm.  3. The endometrium is inadequately visualized due to large bulky fibroids. The largest fibroids likely have significant submucosal components.  4. Trace free fluid in the pelvis, likely functional in the reproductive age setting.  CBC Latest Ref Rng & Units 02/24/2019 02/23/2019 03/09/2014  WBC 4.0 - 10.5 K/uL 5.0 6.0 5.6  Hemoglobin 12.0 - 15.0 g/dL 8.4(L) 6.0(LL) 10.0(L)  Hematocrit 36.0 - 46.0 % 28.7(L) 23.8(L) 32.0(L)  Platelets 150 - 400 K/uL 275 353 551(H)   Assessment:   Diagnoses and all orders for this visit:  Fibroids, submucosal -     megestrol (MEGACE) 40 MG tablet; Take 2 tablets (80 mg total) by mouth 2 (two) times daily. Can increase to two tablets twice a day in the event of heavy bleeding -     tranexamic acid (LYSTEDA) 650 MG TABS tablet; Take 2 tablets (1,300 mg total) by mouth 3 (three) times daily. Take during menses for a maximum of five days -     CBC  Anemia due to chronic blood loss -     megestrol (MEGACE) 40 MG tablet; Take 2 tablets (80 mg total) by mouth 2 (two) times daily. Can increase to two tablets twice a day in the event of heavy bleeding -     tranexamic acid (LYSTEDA) 650 MG TABS tablet; Take 2 tablets (1,300 mg total) by mouth 3 (three) times daily. Take during menses for a maximum of five days -     CBC -     Fe Fum-FePoly-FA-Vit C-Vit B3 (INTEGRA F) 125-1 MG CAPS; Take 1 capsule by mouth daily.  Abnormal uterine bleeding (AUB) -     megestrol (MEGACE) 40 MG tablet; Take 2 tablets (80 mg total) by mouth 2 (two) times daily. Can increase to two tablets twice a day in the event of heavy bleeding -     tranexamic acid (LYSTEDA) 650 MG TABS tablet; Take 2 tablets (1,300 mg total) by mouth 3 (three)  times daily. Take during menses for a maximum of five days -     CBC -     Fe Fum-FePoly-FA-Vit C-Vit B3 (INTEGRA F) 125-1 MG CAPS; Take 1 capsule by mouth daily.  Patient desires surgical management with myomectomy.  The risks of surgery were discussed in detail with the patient including but not limited to: bleeding which may require transfusion or reoperation; infection which may require prolonged hospitalization or re-hospitalization and antibiotic therapy; injury to bowel, bladder, ureters and major vessels or other surrounding organs; need for additional procedures including laparotomy; thromboembolic phenomenon, incisional problems and other postoperative or anesthesia complications.  Patient was told that the likelihood that her condition and symptoms will be treated effectively with this surgical management was very high; the postoperative expectations were also discussed in detail. The patient also understands the  alternative treatment options which were discussed in full. All questions were answered.  She was told that she will be contacted by our surgical scheduler regarding the time and date of her surgery; routine preoperative instructions of having nothing to eat or drink after midnight on the day prior to surgery and also coming to the hospital 1 1/2 hours prior to her time of surgery were also emphasized.  She was told she may be called for a preoperative appointment about a week prior to surgery and will be given further preoperative instructions at that visit. Printed patient education handouts about the procedure were given to the patient to review at home.  The patient was made aware of the delays in nonemergent cases due to [place]. She will f/u in 1 month while we get her blood count up and control her bleeding.    Caelan Branden L. Harraway-Smith, M.D., [facility]

## 2019-03-27 LAB — CBC
Hematocrit: 32.4 % — ABNORMAL LOW (ref 34.0–46.6)
Hemoglobin: 9.6 g/dL — ABNORMAL LOW (ref 11.1–15.9)
MCH: 21 pg — ABNORMAL LOW (ref 26.6–33.0)
MCHC: 29.6 g/dL — ABNORMAL LOW (ref 31.5–35.7)
MCV: 71 fL — ABNORMAL LOW (ref 79–97)
Platelets: 344 10*3/uL (ref 150–450)
RBC: 4.57 x10E6/uL (ref 3.77–5.28)
WBC: 5.1 10*3/uL (ref 3.4–10.8)

## 2019-03-29 ENCOUNTER — Other Ambulatory Visit: Payer: Self-pay | Admitting: Cardiology

## 2019-03-29 DIAGNOSIS — Z20822 Contact with and (suspected) exposure to covid-19: Secondary | ICD-10-CM

## 2019-03-30 LAB — NOVEL CORONAVIRUS, NAA: SARS-CoV-2, NAA: NOT DETECTED

## 2019-04-23 ENCOUNTER — Ambulatory Visit (INDEPENDENT_AMBULATORY_CARE_PROVIDER_SITE_OTHER): Payer: Self-pay | Admitting: Obstetrics & Gynecology

## 2019-04-23 ENCOUNTER — Encounter: Payer: Self-pay | Admitting: Obstetrics & Gynecology

## 2019-04-23 ENCOUNTER — Other Ambulatory Visit (HOSPITAL_COMMUNITY)
Admission: RE | Admit: 2019-04-23 | Discharge: 2019-04-23 | Disposition: A | Discharge status: Home / Self Care | Payer: Medicaid Other | Source: Ambulatory Visit | Attending: Obstetrics & Gynecology | Admitting: Obstetrics & Gynecology

## 2019-04-23 ENCOUNTER — Other Ambulatory Visit: Payer: Self-pay

## 2019-04-23 VITALS — BP 114/67 | HR 83 | Ht 65.0 in | Wt 165.0 lb

## 2019-04-23 DIAGNOSIS — Z01419 Encounter for gynecological examination (general) (routine) without abnormal findings: Secondary | ICD-10-CM | POA: Diagnosis present

## 2019-04-23 DIAGNOSIS — Z0000 Encounter for general adult medical examination without abnormal findings: Secondary | ICD-10-CM

## 2019-04-23 DIAGNOSIS — D250 Submucous leiomyoma of uterus: Secondary | ICD-10-CM

## 2019-04-23 NOTE — Progress Notes (Signed)
Subjective:     Carmen Hays is a 34 y.o. female here for a routine exam.  Current complaints: Pt reports bleeding heavy. She has a h/o fibroids and wants definitive treatment of the fibroids. She has not had pregnancies and is interested in preservation of fertility.       Gynecologic History No LMP recorded (lmp unknown). Contraception: none Last Pap: > 34yrs Last mammogram: n/a  Obstetric History OB History  Gravida Para Term Preterm AB Living  0 0 0 0 0 0  SAB TAB Ectopic Multiple Live Births  0 0 0 0 0   The following portions of the patient's history were reviewed and updated as appropriate: allergies, current medications, past family history, past medical history, past social history, past surgical history and problem list.  Review of Systems Pertinent items are noted in HPI.    Objective:  BP 114/67   Pulse 83   Ht 5\' 5"  (1.651 m)   Wt 165 lb (74.8 kg)   LMP  (LMP Unknown)   BMI 27.46 kg/m  General Appearance:    Alert, cooperative, no distress, appears stated age  Head:    Normocephalic, without obvious abnormality, atraumatic  Eyes:    conjunctiva/corneas clear, EOM's intact, both eyes  Ears:    Normal external ear canals, both ears  Nose:   Nares normal, septum midline, mucosa normal, no drainage    or sinus tenderness  Throat:   Lips, mucosa, and tongue normal; teeth and gums normal  Neck:   Supple, symmetrical, trachea midline, no adenopathy;    thyroid:  no enlargement/tenderness/nodules  Back:     Symmetric, no curvature, ROM normal, no CVA tenderness  Lungs:     respirations unlabored  Chest Wall:    No tenderness or deformity   Heart:    Regular rate and rhythm  Breast Exam:    No tenderness, masses, or nipple abnormality  Abdomen:     Soft, non-tender, bowel sounds active all four quadrants,    no masses, no organomegaly  Genitalia:    Normal female without lesion, discharge or tenderness   Uterus is 14 weeks sized. Mobile. No adnexal masses. There  appears to be a pedunculated fibroid at the fundus.   Extremities:   Extremities normal, atraumatic, no cyanosis or edema  Pulses:   2+ and symmetric all extremities  Skin:   Skin color, texture, turgor normal, no rashes or lesions   02/23/2019 CLINICAL DATA:  Abnormal uterine bleeding for 2 years, worsening, possible fibroids  EXAM: TRANSABDOMINAL AND TRANSVAGINAL ULTRASOUND OF PELVIS  DOPPLER ULTRASOUND OF OVARIES  TECHNIQUE: Both transabdominal and transvaginal ultrasound examinations of the pelvis were performed. Transabdominal technique was performed for global imaging of the pelvis including uterus, ovaries, adnexal regions, and pelvic cul-de-sac.  It was necessary to proceed with endovaginal exam following the transabdominal exam to visualize the uterus and endometrium. Color and duplex Doppler ultrasound was utilized to evaluate blood flow to the ovaries.  COMPARISON:  None.  FINDINGS: Uterus  Measurements: 18.5 x 8.5 x 11.8 cm = volume: 968 mL. There are multiple bulky uterine fibroids, the largest in the uterine fundus measuring 11.2 x 8.2 x 7.1 cm, a second large fibroid in the lower uterine segment measuring 9.1 x 7.5 x 6.8 cm, and a third fibroid in the left aspect of the uterine body measuring 2.9 x 2.5 x 1.9 cm. The largest fibroids likely have significant submucosal components.  Endometrium  Endometrium is obscured by bulky, shadowing  uterine fibroids.  Right ovary  Measurements: 2.8 x 1.5 x 1.2 cm = volume: 2.5 mL. Normal appearance/no adnexal mass.  Left ovary  Measurements: 3.0 x 2.2 x 3.2 cm = volume: 11 mL. Normal appearance/no adnexal mass.  Pulsed Doppler evaluation of both ovaries demonstrates normal low-resistance arterial and venous waveforms.  Other findings  Trace free fluid in the pelvis.  IMPRESSION: 1. There are multiple bulky uterine fibroids, the largest in the uterine fundus measuring 11.2 x 8.2 x 7.1 cm, a  second large fibroid in the lower uterine segment measuring 9.1 x 7.5 x 6.8 cm, and a third fibroid in the left aspect of the uterine body measuring 2.9 x 2.5 x 1.9 cm.  2.  Overall dimensions of the uterus at least 18.5 x 8.5 x 11.8 cm.  3. The endometrium is inadequately visualized due to large bulky fibroids. The largest fibroids likely have significant submucosal components.  4. Trace free fluid in the pelvis, likely functional in the reproductive age setting.  Assessment:    Healthy female exam.   Large symptomatic uterine fibroids. Causing bleeding. Has had a recent hospitalization for transfusion.    Plan:    Follow up in: 6 weeks.    Pt wants to have a myomectomy but, needs to complete paperwork for insurance.  Rec continue TXA tid with cycles  Pt will f/u with me to schedule surgery when she is ready.  F/u PAP  Eshani Maestre L. Harraway-Smith, M.D., [facility]

## 2019-04-24 LAB — CYTOLOGY - PAP
Comment: NEGATIVE
Diagnosis: NEGATIVE
High risk HPV: NEGATIVE

## 2019-05-04 ENCOUNTER — Encounter: Payer: Self-pay | Admitting: Obstetrics & Gynecology

## 2019-06-11 ENCOUNTER — Ambulatory Visit (INDEPENDENT_AMBULATORY_CARE_PROVIDER_SITE_OTHER): Payer: Medicaid Other | Admitting: Obstetrics & Gynecology

## 2019-06-11 ENCOUNTER — Other Ambulatory Visit: Payer: Self-pay

## 2019-06-11 ENCOUNTER — Encounter: Payer: Self-pay | Admitting: Obstetrics & Gynecology

## 2019-06-11 VITALS — BP 131/81 | HR 80 | Ht 65.0 in | Wt 172.0 lb

## 2019-06-11 DIAGNOSIS — D219 Benign neoplasm of connective and other soft tissue, unspecified: Secondary | ICD-10-CM

## 2019-06-11 DIAGNOSIS — D259 Leiomyoma of uterus, unspecified: Secondary | ICD-10-CM

## 2019-06-11 NOTE — Progress Notes (Signed)
History:  34 y.o. G0P0000 here today for preop discussion. Pt is scheduled for a abd myomectomy on April 6th. She is still taking the TXA but, has not needed it on her last cycles as the bleeding has been lighter.    The following portions of the patient's history were reviewed and updated as appropriate: allergies, current medications, past family history, past medical history, past social history, past surgical history and problem list.  Review of Systems:  Pertinent items are noted in HPI.    Objective:  Physical Exam BP 131/81   Pulse 80   Ht 5\' 5"  (1.651 m)   Wt 172 lb (78 kg)   BMI 28.62 kg/m   CONSTITUTIONAL: Well-developed, well-nourished female in no acute distress.  HENT:  Normocephalic, atraumatic EYES: Conjunctivae and EOM are normal. No scleral icterus.  NECK: Normal range of motion SKIN: Skin is warm and dry. No rash noted. Not diaphoretic.No pallor. [place]: Alert and oriented to person, place, and time. Normal coordination.  Pelvic: deferred   Assessment & Plan:  Patient desires surgical management with myomectomy.  The risks of surgery were discussed in detail with the patient including but not limited to: bleeding which may require transfusion or reoperation; infection which may require prolonged hospitalization or re-hospitalization and antibiotic therapy; injury to bowel, bladder, ureters and major vessels or other surrounding organs; need for additional procedures including laparotomy; thromboembolic phenomenon, incisional problems and other postoperative or anesthesia complications.  Patient was told that the likelihood that her condition and symptoms will be treated effectively with this surgical management was very high; the postoperative expectations were also discussed in detail. The patient also understands the alternative treatment options which were discussed in full. All questions were answered.  She was told that she will be contacted by our surgical  scheduler regarding the time and date of her surgery; routine preoperative instructions of having nothing to eat or drink after midnight on the day prior to surgery and also coming to the hospital 1 1/2 hours prior to her time of surgery were also emphasized.  She was told she may be called for a preoperative appointment about a week prior to surgery and will be given further preoperative instructions at that visit. Printed patient education handouts about the procedure were given to the patient to review at home.  Total face-to-face time with patient was 17 min.  Greater than 50% was spent in counseling and coordination of care with the patient.   Jaimie Pippins L. Harraway-Smith, M.D., [facility]

## 2019-06-11 NOTE — Patient Instructions (Signed)
Myomectomy    Myomectomy is a surgery in which a non-cancerous fibroid (myoma) is removed from the uterus. Myomas are tumors made up of fibrous tissue. They are often called fibroid tumors. Fibroid tumors can range from the size of a pea to the size of a grapefruit. In a myomectomy, the fibroid tumor is removed without removing the uterus. Because these tumors are rarely cancerous, this surgery is usually done only if the tumor is growing or causing symptoms such as pain, pressure, bleeding, or pain with intercourse.  Tell a health care provider about:  · Any allergies you have.  · All medicines you are taking, including vitamins, herbs, eye drops, creams, and over-the-counter medicines.  · Any problems you or family members have had with anesthetic medicines.  · Any blood disorders you have.  · Any surgeries you have had.  · Any medical conditions you have.  What are the risks?  Generally, this is a safe procedure. However, problems may occur, including:  · Bleeding.  · Infection.  · Allergic reactions to medicines.  · Damage to other structures or organs.  · Blood clots in the legs, chest, or brain.  · Scar tissue on other organs and in the pelvis. This may require another surgery to remove the scar tissue.  What happens before the procedure?  Staying hydrated  Follow instructions from your health care provider about hydration, which may include:  · Up to 2 hours before the procedure - you may continue to drink clear liquids, such as water, clear fruit juice, black coffee, and plain tea.  Eating and drinking restrictions  Follow instructions from your health care provider about eating and drinking, which may include:  · 8 hours before the procedure - stop eating heavy meals or foods such as meat, fried foods, or fatty foods.  · 6 hours before the procedure - stop eating light meals or foods, such as toast or cereal.  · 6 hours before the procedure - stop drinking milk or drinks that contain milk.  · 2 hours before  the procedure - stop drinking clear liquids.  General instructions  · Ask your health care provider about:  ? Changing or stopping your regular medicines. This is especially important if you are taking diabetes medicines or blood thinners.  ? Taking medicines such as aspirin and ibuprofen. These medicines can thin your blood. Do not take these medicines before your procedure if your health care provider instructs you not to.  · Do not drink alcohol the day before the surgery.  · Do not use any products that contain nicotine or tobacco, such as cigarettes and e-cigarettes, for 2 weeks before the procedure. If you need help quitting, ask your health care provider.  · Plan to have someone take you home from the hospital or clinic. Also arrange for someone to help you with activities during your recovery.  What happens during the procedure?  · To reduce your risk of infection:  ? Your health care team will wash or sanitize their hands.  ? Your skin will be washed with soap.  ? Hair may be removed from the surgical area.  · An IV tube will be inserted into one of your veins. Medicines will be able to flow directly into your body through this IV tube.  · You will be given one or more of the following:  ? A medicine to help you relax (sedative).  ? A medicine to make you fall asleep (general anesthetic).  ·   into your bladder to collect urine.  Your surgeon will use one of the following methods to perform the procedure. The method used will depend on the size, shape, location, and number of fibroids. Hysteroscopic myomectomy This method may be used when the fibroid tumor is inside the cavity of the uterus. A long, thin tube with a lens (hysteroscope) will be inserted into the  uterus through the vagina. A saline solution will be put into the uterus. This will expand the uterus and allow the surgeon to see the fibroids. Tools will be passed through the hysteroscope to remove the fibroid tumor in pieces. Laparoscopic myomectomy A few small incisions will be made in the lower abdomen. A thin, lighted tube with a camera (laparoscope) will be inserted through one of the incisions. This will give the surgeon a good view of the area. The fibroid tumor will be removed through the other incisions. The incisions will then be closed with stitches (sutures) or staples. Abdominal myomectomy This method is used when the fibroid tumor cannot be removed with a hysteroscope or laparoscope. The surgery will be done through a larger surgical incision in the abdomen. The fibroid tumor will be removed through this incision. The incision will be closed with sutures or staples. Recovery time will be longer if this method is used. The procedures may vary among health care providers and hospitals. What happens after the procedure?  Your blood pressure, heart rate, breathing rate, and blood oxygen level will be monitored until the medicines you were given have worn off.  The IV access tube and catheter will remain on your body for a period of time.  You may be given medicine for pain or to help you sleep.  You may be given an antibiotic medicine if needed.  Do not drive for 24 hours if you were given a sedative. Summary  Myomectomy is surgery to remove a noncancerous fibroid (myoma) from the uterus.  This surgery is usually done only if the tumor is growing or causing symptoms such as pain, pressure, bleeding, or pain during intercourse.  Follow instructions from your health care provider about eating and drinking before the procedure.  Recovery time from this procedure depends on the method. The abdominal method will require a longer recovery time. This information is not intended to  replace advice given to you by your health care provider. Make sure you discuss any questions you have with your health care provider. Document Revised: 06/23/2018 Document Reviewed: 04/01/2016 Elsevier Patient Education  2020 [employer] Myomectomy, Care After This sheet gives you information about how to care for yourself after your procedure. Your health care provider may also give you more specific instructions. If you have problems or questions, contact your health care provider. What can I expect after the procedure? After the procedure, it is common to have:  Pain in your abdomen, especially at the incision areas. You will be given pain medicine to control the pain.  Tiredness. This is a normal part of the recovery process. Your energy level will return to normal over the coming weeks.  Vaginal bleeding. This is normal and will stop in the coming weeks.  Constipation. Recovery time from this procedure will depend on the type of procedure you had and your general overall health prior to the procedure. Follow these instructions at home: Medicines  Take over-the-counter and prescription medicines only as told by your health care provider.  Do not take aspirin because it can cause bleeding.  If you   were prescribed an antibiotic medicine, use it as told by your health care provider. Do not stop using the antibiotic even if you start to feel better.  Do not drive or use heavy machinery while taking prescription pain medicine.  Do not drink alcohol while taking prescription pain medicine. Incision care   Follow instructions from your health care provider about how to take care of any incisions. Make sure you: ? Wash your hands with soap and water before you change your bandage (dressing). If soap and water are not available, use hand sanitizer. ? Change your dressing as told by your health care provider. ? Leave stitches (sutures), skin glue, or adhesive strips in place. These  skin closures may need to stay in place for 2 weeks or longer. If adhesive strip edges start to loosen and curl up, you may trim the loose edges. Do not remove adhesive strips completely unless your health care provider tells you to do that.  Check your incision areas every day for signs of infection. Check for: ? Redness, swelling, or pain. ? Fluid or blood. ? Warmth. ? Pus or a bad smell.  Do not take baths, swim, or use a hot tub until your health care provider approves. Take showers as directed by your health care provider. Activity  Return to your normal activities as told by your health care provider. Ask your health care provider what activities are safe for you.  Do not do activities that require a lot of effort until your health care provider says it is okay.  Do not lift anything that is heavier than 15 lb (6.8 kg) until your health care provider says that it is safe.  Do not douche, use tampons, or have sexual intercourse until your health care provider approves.  Walk daily but take frequent rest breaks if you tire easily.  Continue to practice deep breathing and coughing. If it hurts to cough, try holding a pillow against your belly as you cough.  Do not drive until your health care provider approves. General instructions  To prevent or treat constipation while you are taking prescription pain medicine, your health care provider may recommend that you: ? Drink enough fluid to keep your urine clear or pale yellow. ? Take over-the-counter or prescription medicines. ? Eat foods that are high in fiber, such as fresh fruits and vegetables, whole grains, and beans. ? Limit foods that are high in fat and processed sugars, such as fried and sweet foods.  Take your temperature twice a day and write it down. If you develop a fever, this may be a sign that you have an infection.  Do not drink alcohol.  Have someone help you at home for 1 week or until you can do your own  household activities.  Keep all follow-up visits as told by your health care provider. This is important. Contact a health care provider if:  You have a fever.  You have increasing abdominal pain that is not relieved with medicine.  You have nausea, vomiting, or diarrhea.  You have pain when you urinate or you have blood in your urine.  You have a rash on your body.  You have pain or redness where your IV access tube was inserted.  You have redness, swelling, or pain around an incision.  You have fluid or blood coming from an incision.  An incision feels warm to the touch.  You have pus or a bad smell coming from an incision. Get help right   away if:  You have weakness or light-headedness.  You have pain, swelling, or redness in your legs.  You have chest pain.  You faint.  You have shortness of breath.  You have heavy vaginal bleeding.  You have an incision that is opening up. Summary  Recovery time from this procedure will depend on the type of procedure you had and your general overall health prior to the procedure.  If you were prescribed an antibiotic medicine, use it as told by your health care provider. Do not stop using the antibiotic even if you start to feel better.  Do not douche, use tampons, or have sexual intercourse until your health care provider approves.  Return to your normal activities as told by your health care provider. Ask your health care provider what activities are safe for you. This information is not intended to replace advice given to you by your health care provider. Make sure you discuss any questions you have with your health care provider. Document Revised: 02/11/2017 Document Reviewed: 04/01/2016 Elsevier Patient Education  2020 [employer] Inc[employer]  

## 2019-06-14 NOTE — Pre-Procedure Instructions (Addendum)
Carmen Hays  06/14/2019      [id number] - [address], [place] [address] PhoneSE:[phone] FaxXO:[phone]    Your procedure is scheduled on April 6  Report to [facility] Entrance A at 9:00 A.M.  Call this number if you have problems the morning of surgery:  [phone]   Remember:  Do not eat after midnight.  You may drink clear liquids until 8;00 A.M.   .  Clear liquids allowed are:   Water, Juice (non-citric and without pulp), Carbonated beverages, Clear Tea, Black Coffee only, Plain Jell-O only, Gatorade and Plain Popsicles only    Take these medicines the morning of surgery with A SIP OF WATER : none               7 days prior to surgery STOP taking any Aspirin (unless otherwise instructed by your surgeon), Aleve, Naproxen, Ibuprofen, Motrin, Advil, Goody's, BC's, all herbal medications, fish oil, and all vitamins.    Do not wear jewelry, make-up or nail polish.  Do not wear lotions, powders, or perfumes, or deodorant.  Do not shave 48 hours prior to surgery.  Men may shave face and neck.  Do not bring valuables to the hospital.  [facility] is not responsible for any belongings or valuables.  Contacts, dentures or bridgework may not be worn into surgery.  Leave your suitcase in the car.  After surgery it may be brought to your room.  For patients admitted to the hospital, discharge time will be determined by your treatment team.  Patients discharged the day of surgery will not be allowed to drive home.    Special instructions:   [place]- Preparing For Surgery  Before surgery, you can play an important role. Because skin is not sterile, your skin needs to be as free of germs as possible. You can reduce the number of germs on your skin by washing with CHG (chlorahexidine gluconate) Soap before surgery.  CHG is an antiseptic cleaner which kills germs and bonds with the skin to continue killing germs even  after washing.    Oral Hygiene is also important to reduce your risk of infection.  Remember - BRUSH YOUR TEETH THE MORNING OF SURGERY WITH YOUR REGULAR TOOTHPASTE  Please do not use if you have an allergy to CHG or antibacterial soaps. If your skin becomes reddened/irritated stop using the CHG.  Do not shave (including legs and underarms) for at least 48 hours prior to first CHG shower. It is OK to shave your face.  Please follow these instructions carefully.   1. Shower the NIGHT BEFORE SURGERY and the MORNING OF SURGERY with CHG.   2. If you chose to wash your hair, wash your hair first as usual with your normal shampoo.  3. After you shampoo, rinse your hair and body thoroughly to remove the shampoo.  4. Use CHG as you would any other liquid soap. You can apply CHG directly to the skin and wash gently with a scrungie or a clean washcloth.   5. Apply the CHG Soap to your body ONLY FROM THE NECK DOWN.  Do not use on open wounds or open sores. Avoid contact with your eyes, ears, mouth and genitals (private parts). Wash Face and genitals (private parts)  with your normal soap.  6. Wash thoroughly, paying special attention to the area where your surgery will be performed.  7. Thoroughly rinse your body with warm  water from the neck down.  8. DO NOT shower/wash with your normal soap after using and rinsing off the CHG Soap.  9. Pat yourself dry with a CLEAN TOWEL.  10. Wear CLEAN PAJAMAS to bed the night before surgery, wear comfortable clothes the morning of surgery  11. Place CLEAN SHEETS on your bed the night of your first shower and DO NOT SLEEP WITH PETS.    Day of Surgery:  Do not apply any deodorants/lotions.  Please wear clean clothes to the hospital/surgery center.   Remember to brush your teeth WITH YOUR REGULAR TOOTHPASTE.    Please read over the following fact sheets that you were given. Coughing and Deep Breathing and Surgical Site Infection Prevention

## 2019-06-15 ENCOUNTER — Encounter (HOSPITAL_COMMUNITY): Payer: Self-pay

## 2019-06-15 ENCOUNTER — Other Ambulatory Visit: Payer: Self-pay

## 2019-06-15 ENCOUNTER — Encounter (HOSPITAL_COMMUNITY)
Admission: RE | Admit: 2019-06-15 | Discharge: 2019-06-15 | Disposition: A | Discharge status: Home / Self Care | Payer: Medicaid Other | Source: Ambulatory Visit | Attending: Obstetrics & Gynecology | Admitting: Obstetrics & Gynecology

## 2019-06-15 ENCOUNTER — Other Ambulatory Visit (HOSPITAL_COMMUNITY)
Admission: RE | Admit: 2019-06-15 | Discharge: 2019-06-15 | Disposition: A | Discharge status: Home / Self Care | Payer: Medicaid Other | Source: Ambulatory Visit | Attending: Obstetrics & Gynecology | Admitting: Obstetrics & Gynecology

## 2019-06-15 DIAGNOSIS — Z20822 Contact with and (suspected) exposure to covid-19: Secondary | ICD-10-CM | POA: Diagnosis not present

## 2019-06-15 DIAGNOSIS — Z01812 Encounter for preprocedural laboratory examination: Secondary | ICD-10-CM | POA: Diagnosis not present

## 2019-06-15 HISTORY — DX: Anemia, unspecified: D64.9

## 2019-06-15 HISTORY — DX: Bronchitis, not specified as acute or chronic: J40

## 2019-06-15 LAB — CBC
HCT: 40.7 % (ref 36.0–46.0)
Hemoglobin: 12.9 g/dL (ref 12.0–15.0)
MCH: 25.4 pg — ABNORMAL LOW (ref 26.0–34.0)
MCHC: 31.7 g/dL (ref 30.0–36.0)
MCV: 80.1 fL (ref 80.0–100.0)
Platelets: 364 10*3/uL (ref 150–400)
RBC: 5.08 MIL/uL (ref 3.87–5.11)
RDW: 16.1 % — ABNORMAL HIGH (ref 11.5–15.5)
WBC: 5.1 10*3/uL (ref 4.0–10.5)
nRBC: 0 % (ref 0.0–0.2)

## 2019-06-15 LAB — TYPE AND SCREEN
ABO/RH(D): B POS
Antibody Screen: NEGATIVE

## 2019-06-15 LAB — SARS CORONAVIRUS 2 (TAT 6-24 HRS): SARS Coronavirus 2: NEGATIVE

## 2019-06-15 NOTE — Progress Notes (Signed)
PCP - none   Chest x-ray - na EKG - na Stress Test - na ECHO - na Cardiac Cath - na  Sleep Study - na  Fasting Blood Sugar - na Checks Blood Sugar _____ times a day  Blood Thinner Instructions: na Aspirin Instructions:  ERAS Protcol -yes PRE-SURGERY Ensure or G2-  Drink not ordered  COVID TEST- 06/15/19   Anesthesia review:   Patient denies shortness of breath, fever, cough and chest pain at PAT appointment   All instructions explained to the patient, with a verbal understanding of the material. Patient agrees to go over the instructions while at home for a better understanding. Patient also instructed to self quarantine after being tested for COVID-19. The opportunity to ask questions was provided.

## 2019-06-19 ENCOUNTER — Inpatient Hospital Stay (HOSPITAL_COMMUNITY)
Admission: RE | Admit: 2019-06-19 | Discharge: 2019-06-20 | DRG: 742 | Disposition: A | Discharge status: Home / Self Care | Payer: Self-pay | Attending: Obstetrics & Gynecology | Admitting: Obstetrics & Gynecology

## 2019-06-19 ENCOUNTER — Inpatient Hospital Stay (HOSPITAL_COMMUNITY): Payer: Self-pay | Admitting: Vascular Surgery

## 2019-06-19 ENCOUNTER — Encounter (HOSPITAL_COMMUNITY): Payer: Self-pay | Admitting: Obstetrics & Gynecology

## 2019-06-19 ENCOUNTER — Inpatient Hospital Stay (HOSPITAL_COMMUNITY): Payer: Self-pay | Admitting: Anesthesiology

## 2019-06-19 ENCOUNTER — Encounter (HOSPITAL_COMMUNITY)
Admission: RE | Disposition: A | Discharge status: Home / Self Care | Payer: Self-pay | Source: Home / Self Care | Attending: Obstetrics & Gynecology

## 2019-06-19 ENCOUNTER — Other Ambulatory Visit: Payer: Self-pay

## 2019-06-19 DIAGNOSIS — Z20822 Contact with and (suspected) exposure to covid-19: Secondary | ICD-10-CM | POA: Diagnosis present

## 2019-06-19 DIAGNOSIS — D250 Submucous leiomyoma of uterus: Secondary | ICD-10-CM

## 2019-06-19 DIAGNOSIS — Z880 Allergy status to penicillin: Secondary | ICD-10-CM

## 2019-06-19 DIAGNOSIS — D251 Intramural leiomyoma of uterus: Secondary | ICD-10-CM

## 2019-06-19 DIAGNOSIS — D252 Subserosal leiomyoma of uterus: Secondary | ICD-10-CM

## 2019-06-19 DIAGNOSIS — Z9889 Other specified postprocedural states: Secondary | ICD-10-CM

## 2019-06-19 DIAGNOSIS — Z8249 Family history of ischemic heart disease and other diseases of the circulatory system: Secondary | ICD-10-CM

## 2019-06-19 DIAGNOSIS — D259 Leiomyoma of uterus, unspecified: Principal | ICD-10-CM | POA: Diagnosis present

## 2019-06-19 DIAGNOSIS — N920 Excessive and frequent menstruation with regular cycle: Secondary | ICD-10-CM

## 2019-06-19 DIAGNOSIS — Z833 Family history of diabetes mellitus: Secondary | ICD-10-CM

## 2019-06-19 DIAGNOSIS — D62 Acute posthemorrhagic anemia: Secondary | ICD-10-CM

## 2019-06-19 DIAGNOSIS — Z79899 Other long term (current) drug therapy: Secondary | ICD-10-CM

## 2019-06-19 DIAGNOSIS — Z809 Family history of malignant neoplasm, unspecified: Secondary | ICD-10-CM

## 2019-06-19 HISTORY — PX: MYOMECTOMY: SHX85

## 2019-06-19 LAB — POCT PREGNANCY, URINE: Preg Test, Ur: NEGATIVE

## 2019-06-19 SURGERY — MYOMECTOMY, ABDOMINAL APPROACH
Anesthesia: General | Site: Abdomen

## 2019-06-19 MED ORDER — NALOXONE HCL 0.4 MG/ML IJ SOLN: 0.4000 mg | INTRAMUSCULAR | Status: DC | PRN

## 2019-06-19 MED ORDER — PROPOFOL 10 MG/ML IV BOLUS
INTRAVENOUS | Status: DC | PRN
  Administered 2019-06-19: 200 mg via INTRAVENOUS

## 2019-06-19 MED ORDER — GENTAMICIN SULFATE 40 MG/ML IJ SOLN
5.0000 mg/kg | INTRAVENOUS | Status: AC
  Administered 2019-06-19: 330 mg via INTRAVENOUS
  Filled 2019-06-19: qty 8.25

## 2019-06-19 MED ORDER — FENTANYL CITRATE (PF) 100 MCG/2ML IJ SOLN
INTRAMUSCULAR | Status: DC | PRN
  Administered 2019-06-19 (×2): 50 ug via INTRAVENOUS
  Administered 2019-06-19: 100 ug via INTRAVENOUS
  Administered 2019-06-19: 50 ug via INTRAVENOUS

## 2019-06-19 MED ORDER — ZOLPIDEM TARTRATE 5 MG PO TABS: 5.0000 mg | ORAL_TABLET | Freq: Every evening | ORAL | Status: DC | PRN

## 2019-06-19 MED ORDER — ALBUTEROL SULFATE HFA 108 (90 BASE) MCG/ACT IN AERS: 2.0000 | INHALATION_SPRAY | RESPIRATORY_TRACT | Status: DC | PRN

## 2019-06-19 MED ORDER — ALBUTEROL SULFATE (2.5 MG/3ML) 0.083% IN NEBU: 2.5000 mg | INHALATION_SOLUTION | RESPIRATORY_TRACT | Status: DC | PRN

## 2019-06-19 MED ORDER — MIDAZOLAM HCL 2 MG/2ML IJ SOLN
INTRAMUSCULAR | Status: AC
  Filled 2019-06-19: qty 2

## 2019-06-19 MED ORDER — ROCURONIUM BROMIDE 50 MG/5ML IV SOSY
PREFILLED_SYRINGE | INTRAVENOUS | Status: DC | PRN
  Administered 2019-06-19: 80 mg via INTRAVENOUS

## 2019-06-19 MED ORDER — SCOPOLAMINE 1 MG/3DAYS TD PT72
MEDICATED_PATCH | TRANSDERMAL | Status: AC
  Filled 2019-06-19: qty 1

## 2019-06-19 MED ORDER — LACTATED RINGERS IV SOLN: INTRAVENOUS | Status: DC

## 2019-06-19 MED ORDER — GABAPENTIN 300 MG PO CAPS
300.0000 mg | ORAL_CAPSULE | ORAL | Status: AC
  Administered 2019-06-19: 10:00:00 300 mg via ORAL
  Filled 2019-06-19: qty 1

## 2019-06-19 MED ORDER — KETOROLAC TROMETHAMINE 30 MG/ML IJ SOLN: 30.0000 mg | Freq: Once | INTRAMUSCULAR | Status: DC

## 2019-06-19 MED ORDER — BUPIVACAINE HCL (PF) 0.5 % IJ SOLN
INTRAMUSCULAR | Status: AC
  Filled 2019-06-19: qty 30

## 2019-06-19 MED ORDER — SIMETHICONE 80 MG PO CHEW
80.0000 mg | CHEWABLE_TABLET | Freq: Four times a day (QID) | ORAL | Status: DC | PRN
  Administered 2019-06-20 (×2): 80 mg via ORAL
  Filled 2019-06-19 (×2): qty 1

## 2019-06-19 MED ORDER — LIDOCAINE 2% (20 MG/ML) 5 ML SYRINGE
INTRAMUSCULAR | Status: AC
  Filled 2019-06-19: qty 5

## 2019-06-19 MED ORDER — DEXAMETHASONE SODIUM PHOSPHATE 10 MG/ML IJ SOLN
INTRAMUSCULAR | Status: AC
  Filled 2019-06-19: qty 1

## 2019-06-19 MED ORDER — ESMOLOL HCL 100 MG/10ML IV SOLN
INTRAVENOUS | Status: AC
  Filled 2019-06-19: qty 10

## 2019-06-19 MED ORDER — PROMETHAZINE HCL 25 MG/ML IJ SOLN: 6.2500 mg | INTRAMUSCULAR | Status: DC | PRN

## 2019-06-19 MED ORDER — IBUPROFEN 800 MG PO TABS
800.0000 mg | ORAL_TABLET | Freq: Four times a day (QID) | ORAL | Status: DC
  Administered 2019-06-20: 800 mg via ORAL
  Filled 2019-06-19: qty 1

## 2019-06-19 MED ORDER — PANTOPRAZOLE SODIUM 40 MG IV SOLR
40.0000 mg | Freq: Every day | INTRAVENOUS | Status: DC
  Administered 2019-06-19: 40 mg via INTRAVENOUS
  Filled 2019-06-19: qty 40

## 2019-06-19 MED ORDER — DEXAMETHASONE SODIUM PHOSPHATE 10 MG/ML IJ SOLN
INTRAMUSCULAR | Status: DC | PRN
  Administered 2019-06-19: 5 mg via INTRAVENOUS

## 2019-06-19 MED ORDER — OXYCODONE-ACETAMINOPHEN 5-325 MG PO TABS
1.0000 | ORAL_TABLET | ORAL | Status: DC | PRN
  Administered 2019-06-20: 16:00:00 2 via ORAL
  Filled 2019-06-19: qty 2

## 2019-06-19 MED ORDER — KETOROLAC TROMETHAMINE 30 MG/ML IJ SOLN
30.0000 mg | Freq: Four times a day (QID) | INTRAMUSCULAR | Status: AC
  Administered 2019-06-19 – 2019-06-20 (×4): 30 mg via INTRAVENOUS
  Filled 2019-06-19 (×4): qty 1

## 2019-06-19 MED ORDER — TRAMADOL HCL 50 MG PO TABS: 50.0000 mg | ORAL_TABLET | Freq: Four times a day (QID) | ORAL | Status: DC | PRN

## 2019-06-19 MED ORDER — ONDANSETRON HCL 4 MG/2ML IJ SOLN
INTRAMUSCULAR | Status: AC
  Filled 2019-06-19: qty 2

## 2019-06-19 MED ORDER — BUPIVACAINE-EPINEPHRINE (PF) 0.5% -1:200000 IJ SOLN
INTRAMUSCULAR | Status: DC | PRN
  Administered 2019-06-19 (×2): 20 mL

## 2019-06-19 MED ORDER — DIPHENHYDRAMINE HCL 12.5 MG/5ML PO ELIX: 12.5000 mg | ORAL_SOLUTION | Freq: Four times a day (QID) | ORAL | Status: DC | PRN

## 2019-06-19 MED ORDER — VASOPRESSIN 20 UNIT/ML IV SOLN
INTRAVENOUS | Status: DC | PRN
  Administered 2019-06-19 (×2): 10 mL via INTRAMUSCULAR

## 2019-06-19 MED ORDER — SUGAMMADEX SODIUM 200 MG/2ML IV SOLN
INTRAVENOUS | Status: DC | PRN
  Administered 2019-06-19: 180 mg via INTRAVENOUS

## 2019-06-19 MED ORDER — CELECOXIB 200 MG PO CAPS
200.0000 mg | ORAL_CAPSULE | Freq: Once | ORAL | Status: AC
  Administered 2019-06-19: 10:00:00 200 mg via ORAL
  Filled 2019-06-19: qty 1

## 2019-06-19 MED ORDER — HYDROMORPHONE HCL 1 MG/ML IJ SOLN
INTRAMUSCULAR | Status: DC | PRN
  Administered 2019-06-19: .5 mg via INTRAVENOUS

## 2019-06-19 MED ORDER — SCOPOLAMINE 1 MG/3DAYS TD PT72
MEDICATED_PATCH | TRANSDERMAL | Status: DC | PRN
  Administered 2019-06-19: 1 via TRANSDERMAL

## 2019-06-19 MED ORDER — MIDAZOLAM HCL 5 MG/5ML IJ SOLN
INTRAMUSCULAR | Status: DC | PRN
  Administered 2019-06-19: 2 mg via INTRAVENOUS

## 2019-06-19 MED ORDER — SODIUM CHLORIDE (PF) 0.9 % IJ SOLN
INTRAMUSCULAR | Status: AC
  Filled 2019-06-19: qty 100

## 2019-06-19 MED ORDER — HYDROMORPHONE 1 MG/ML IV SOLN
INTRAVENOUS | Status: AC
  Administered 2019-06-19: 1.4 mg via INTRAVENOUS
  Administered 2019-06-19: 0.3 mg via INTRAVENOUS
  Administered 2019-06-19: 30 mg via INTRAVENOUS
  Administered 2019-06-20: 0.8 mg via INTRAVENOUS
  Filled 2019-06-19: qty 30

## 2019-06-19 MED ORDER — BISACODYL 10 MG RE SUPP
10.0000 mg | Freq: Every day | RECTAL | Status: DC | PRN
  Administered 2019-06-20: 14:00:00 10 mg via RECTAL
  Filled 2019-06-19: qty 1

## 2019-06-19 MED ORDER — ESMOLOL HCL 100 MG/10ML IV SOLN
INTRAVENOUS | Status: DC | PRN
  Administered 2019-06-19: 10 mg via INTRAVENOUS

## 2019-06-19 MED ORDER — DEXTROSE-NACL 5-0.45 % IV SOLN: INTRAVENOUS | Status: DC

## 2019-06-19 MED ORDER — VASOPRESSIN 20 UNIT/ML IV SOLN
INTRAVENOUS | Status: AC
  Filled 2019-06-19: qty 1

## 2019-06-19 MED ORDER — SODIUM CHLORIDE 0.9% FLUSH: 9.0000 mL | INTRAVENOUS | Status: DC | PRN

## 2019-06-19 MED ORDER — PROPOFOL 10 MG/ML IV BOLUS
INTRAVENOUS | Status: AC
  Filled 2019-06-19: qty 20

## 2019-06-19 MED ORDER — ONDANSETRON HCL 4 MG/2ML IJ SOLN: 4.0000 mg | Freq: Four times a day (QID) | INTRAMUSCULAR | Status: DC | PRN

## 2019-06-19 MED ORDER — ONDANSETRON HCL 4 MG PO TABS: 4.0000 mg | ORAL_TABLET | Freq: Four times a day (QID) | ORAL | Status: DC | PRN

## 2019-06-19 MED ORDER — DIPHENHYDRAMINE HCL 50 MG/ML IJ SOLN
12.5000 mg | Freq: Four times a day (QID) | INTRAMUSCULAR | Status: DC | PRN
  Administered 2019-06-19: 12.5 mg via INTRAVENOUS
  Filled 2019-06-19: qty 1

## 2019-06-19 MED ORDER — ACETAMINOPHEN 500 MG PO TABS
1000.0000 mg | ORAL_TABLET | ORAL | Status: AC
  Administered 2019-06-19: 10:00:00 1000 mg via ORAL
  Filled 2019-06-19: qty 2

## 2019-06-19 MED ORDER — HYDROMORPHONE HCL 1 MG/ML IJ SOLN
INTRAMUSCULAR | Status: AC
  Filled 2019-06-19: qty 0.5

## 2019-06-19 MED ORDER — ROCURONIUM BROMIDE 10 MG/ML (PF) SYRINGE
PREFILLED_SYRINGE | INTRAVENOUS | Status: AC
  Filled 2019-06-19: qty 10

## 2019-06-19 MED ORDER — DOCUSATE SODIUM 100 MG PO CAPS
100.0000 mg | ORAL_CAPSULE | Freq: Two times a day (BID) | ORAL | Status: DC
  Administered 2019-06-19 – 2019-06-20 (×3): 100 mg via ORAL
  Filled 2019-06-19 (×3): qty 1

## 2019-06-19 MED ORDER — CLINDAMYCIN PHOSPHATE 900 MG/50ML IV SOLN
900.0000 mg | INTRAVENOUS | Status: AC
  Administered 2019-06-19: 12:00:00 900 mg via INTRAVENOUS

## 2019-06-19 MED ORDER — CLINDAMYCIN PHOSPHATE 900 MG/50ML IV SOLN
INTRAVENOUS | Status: AC
  Filled 2019-06-19: qty 50

## 2019-06-19 MED ORDER — POLYETHYLENE GLYCOL 3350 17 G PO PACK: 17.0000 g | PACK | Freq: Every day | ORAL | Status: DC | PRN

## 2019-06-19 MED ORDER — ONDANSETRON HCL 4 MG/2ML IJ SOLN
INTRAMUSCULAR | Status: DC | PRN
  Administered 2019-06-19: 4 mg via INTRAVENOUS

## 2019-06-19 MED ORDER — FENTANYL CITRATE (PF) 100 MCG/2ML IJ SOLN: 25.0000 ug | INTRAMUSCULAR | Status: DC | PRN

## 2019-06-19 MED ORDER — FENTANYL CITRATE (PF) 250 MCG/5ML IJ SOLN
INTRAMUSCULAR | Status: AC
  Filled 2019-06-19: qty 5

## 2019-06-19 SURGICAL SUPPLY — 44 items
APL SKNCLS STERI-STRIP NONHPOA (GAUZE/BANDAGES/DRESSINGS) ×1
BENZOIN TINCTURE PRP APPL 2/3 (GAUZE/BANDAGES/DRESSINGS) ×3 IMPLANT
CANISTER SUCT 3000ML PPV (MISCELLANEOUS) ×3 IMPLANT
CLOSURE WOUND 1/2 X4 (GAUZE/BANDAGES/DRESSINGS) ×1
DRAIN PENROSE 1/4X12 LTX (DRAIN) ×2 IMPLANT
DRAPE CESAREAN BIRTH W POUCH (DRAPES) ×3 IMPLANT
DRSG OPSITE POSTOP 4X10 (GAUZE/BANDAGES/DRESSINGS) ×2 IMPLANT
DURAPREP 26ML APPLICATOR (WOUND CARE) ×3 IMPLANT
ELECT BLADE 4.0 EZ CLEAN MEGAD (MISCELLANEOUS) ×3
ELECTRODE BLDE 4.0 EZ CLN MEGD (MISCELLANEOUS) IMPLANT
GAUZE 4X4 16PLY RFD (DISPOSABLE) ×4 IMPLANT
GAUZE SPONGE 4X4 12PLY STRL LF (GAUZE/BANDAGES/DRESSINGS) ×2 IMPLANT
GLOVE BIO SURGEON STRL SZ7 (GLOVE) ×3 IMPLANT
GLOVE BIOGEL PI IND STRL 7.0 (GLOVE) ×3 IMPLANT
GLOVE BIOGEL PI INDICATOR 7.0 (GLOVE) ×6
GOWN STRL REUS W/ TWL LRG LVL3 (GOWN DISPOSABLE) ×2 IMPLANT
GOWN STRL REUS W/ TWL XL LVL3 (GOWN DISPOSABLE) ×1 IMPLANT
GOWN STRL REUS W/TWL LRG LVL3 (GOWN DISPOSABLE) ×6
GOWN STRL REUS W/TWL XL LVL3 (GOWN DISPOSABLE) ×3
KIT TURNOVER KIT B (KITS) ×3 IMPLANT
NEEDLE HYPO 22GX1.5 SAFETY (NEEDLE) ×6 IMPLANT
NS IRRIG 1000ML POUR BTL (IV SOLUTION) ×3 IMPLANT
PACK ABDOMINAL GYN (CUSTOM PROCEDURE TRAY) ×3 IMPLANT
PAD ABD 8X10 STRL (GAUZE/BANDAGES/DRESSINGS) ×2 IMPLANT
PAD ARMBOARD 7.5X6 YLW CONV (MISCELLANEOUS) ×6 IMPLANT
PAD OB MATERNITY 4.3X12.25 (PERSONAL CARE ITEMS) ×3 IMPLANT
SPECIMEN JAR MEDIUM (MISCELLANEOUS) ×3 IMPLANT
SPONGE LAP 18X18 RF (DISPOSABLE) ×8 IMPLANT
STRIP CLOSURE SKIN 1/2X4 (GAUZE/BANDAGES/DRESSINGS) ×2 IMPLANT
SUT VIC AB 0 CT1 18XCR BRD8 (SUTURE) ×1 IMPLANT
SUT VIC AB 0 CT1 27 (SUTURE) ×33
SUT VIC AB 0 CT1 27XBRD ANBCTR (SUTURE) ×6 IMPLANT
SUT VIC AB 0 CT1 8-18 (SUTURE) ×3
SUT VIC AB 3-0 CT1 27 (SUTURE) ×3
SUT VIC AB 3-0 CT1 36 (SUTURE) ×8 IMPLANT
SUT VIC AB 3-0 CT1 TAPERPNT 27 (SUTURE) ×1 IMPLANT
SUT VIC AB 3-0 SH 27 (SUTURE)
SUT VIC AB 3-0 SH 27X BRD (SUTURE) IMPLANT
SUT VIC AB 4-0 KS 27 (SUTURE) ×5 IMPLANT
SYR CONTROL 10ML LL (SYRINGE) ×6 IMPLANT
TAPE PAPER 3X10 WHT MICROPORE (GAUZE/BANDAGES/DRESSINGS) ×2 IMPLANT
TOWEL GREEN STERILE FF (TOWEL DISPOSABLE) ×6 IMPLANT
TRAY FOLEY W/BAG SLVR 14FR (SET/KITS/TRAYS/PACK) ×3 IMPLANT
YANKAUER SUCT BULB TIP NO VENT (SUCTIONS) ×2 IMPLANT

## 2019-06-19 NOTE — Op Note (Addendum)
06/19/2019  2:11 PM  PATIENT:  Carmen Hays  34 y.o. female  PRE-OPERATIVE DIAGNOSIS:  Fibroids  POST-OPERATIVE DIAGNOSIS:  fibroids   PROCEDURE:  Procedure(s): ABDOMINAL MYOMECTOMY (N/A)  SURGEON:  Surgeon(s) and Role:    * Lavonia Drafts, MD - Primary    * Chancy Milroy, MD - Assisting  ANESTHESIA:   general  EBL:  30 mL   BLOOD ADMINISTERED:none  DRAINS: none   LOCAL MEDICATIONS USED:  MARCAINE     SPECIMEN:  Source of Specimen:  uterine fibroids  DISPOSITION OF SPECIMEN:  PATHOLOGY  COUNTS:  YES  TOURNIQUET:  * No tourniquets in log *  DICTATION: .Note written in EPIC  PLAN OF CARE: Admit to inpatient   PATIENT DISPOSITION:  PACU - hemodynamically stable.   Delay start of Pharmacological VTE agent (>24hrs) due to surgical blood loss or risk of bleeding: yes  Complications: none immediate.   Pt here for abdominal myomectomy for symptomatic uterine leiomyomata. The risks of surgery were discussed with the patient including but not limited to: bleeding which may require transfusion or reoperation; infection which may require antibiotics; injury to bowel, bladder, ureters or other surrounding organs; need for additional procedures; thromboembolic phenomenon, incisional problems and other postoperative/anesthesia complications. Written informed consent was obtained.    PROCEDURE IN DETAIL:  The patient received IV preoperative antibiotics approximately 30 minutes prior to procedure.  She was then taken to the operating room and general anesthesia was administered without difficulty. (of note, the patient received a TAP block by anesthesia in the preop area). She was placed in dorsal supine position and prepped and draped in a sterile manner.  A Foley catheter was inserted into the bladder and attached to constant drainage.  After an adequate timeout was performed, attention was then turned to the abdomen where a transverse skin  incision was made 4 cm above  the symphysis pubis with a scalpel.  This incision was carried down to the fascia using electrocautery.  The fascia was incised in the midline and this incision was extended bilaterally using the Mayo scissors.  Kocher's were applied to the superior aspect of the fascial incision, and the rectus muscles were dissected off bluntly and sharply using the Mayo scissors.  A similar process was carried out at the inferior aspect of the incision.  The rectus muscles were separated in the midline bluntly and the peritoneum was identified, picked up and incised with the scalpel.  This incision was extended superiorly and inferiorly using the scalpel with good visualization of the bladder and bowel.  Attention was then turned to the uterus where multiple uterine leiomyomata were noted. A tourniquet was placed around the lower uterine segment. The uterus was then delivered up through the incision. Attention was then turned to the uterine surface where the largest leiomyoma was identified. Vasopressin solution was injected over the surface of the leiomyoma to aid with hemostasis.  Due to the placement and irregular contour of the uterus due to the fibroids 2 large incision were required. One anterior and transverse and one posterior and transverse.  Using blunt and sharp methods, the leiomyoma was freed from the surrounding myometrial tissue and removed intact.  Other leiomyomata were removed in similar fashion.  After removal of all the leiomyomata which were both on the anterior and posterior aspects of the uterus, the incisions were closed in layers, initiated by using 0 Vicryl in a running locked stitch.  The deeper layers were also reapproximated using 0 Vicryl  running stitches, and the serosa was reapproximated using 0 Vicryl imbricating stitch.  Overall good hemostasis was noted.   The uterus was returned to the abdomen.  The laparotomy sponges were then removed from the abdomen.  The pelvis was irrigated with warmed  normal saline. The peritoneum was re approximated using 0 vicryl.  The fascia was reapproximated with 0 Vicryl running stitch and the subcutaneous layer was reapproximated with 3-0 vicryl in a running suture.  The skin was closed with 4-0 Vicryl subcuticular stitch using a Lanny Hurst needle.  The patient tolerated the procedure well. There were no complications during this case.  Sponge, lap, needle and instrument counts were correct x2.  The patient was taken to the recovery room extubated and in stable condition.  PATIENT WELL NEED CESAREAN SECTION AT 55 WEEKS IF PREGNANT.   An experienced assistant was required given the standard of surgical care given the complexity of the case.  This assistant was needed for exposure, dissection, suctioning, retraction, instrument exchange, assisting with delivery with administration of fundal pressure, and for overall help during the procedure.  Annalissa Murphey L. Harraway-Smith, M.D., [facility]

## 2019-06-19 NOTE — Brief Op Note (Signed)
06/19/2019  2:11 PM  PATIENT:  Carmen Hays  34 y.o. female  PRE-OPERATIVE DIAGNOSIS:  Fibroids  POST-OPERATIVE DIAGNOSIS:  fibroids   PROCEDURE:  Procedure(s): ABDOMINAL MYOMECTOMY (N/A)  SURGEON:  Surgeon(s) and Role:    * Lavonia Drafts, MD - Primary    * Chancy Milroy, MD - Assisting  ANESTHESIA:   general  EBL:  30 mL   BLOOD ADMINISTERED:none  DRAINS: none   LOCAL MEDICATIONS USED:  MARCAINE     SPECIMEN:  Source of Specimen:  uterine fibroids  DISPOSITION OF SPECIMEN:  PATHOLOGY  COUNTS:  YES  TOURNIQUET:  * No tourniquets in log *  DICTATION: .Note written in EPIC  PLAN OF CARE: Admit to inpatient   PATIENT DISPOSITION:  PACU - hemodynamically stable.   Delay start of Pharmacological VTE agent (>24hrs) due to surgical blood loss or risk of bleeding: yes  Complications: none immediate.   Carmen Hays L. Ihor Dow, M.D., [place] .

## 2019-06-19 NOTE — Progress Notes (Signed)
Patient arrived to room 6N03 via bed from PACU. Patient is alert and oriented times 4. Belongings and call bell within reach. Bed is in the lowest and locked position with bed rails up times 2. Patient c/o 6/10 pain. PCA ordered.

## 2019-06-19 NOTE — H&P (Signed)
Preoperative History and Physical  Carmen Hays is a 34 y.o. G0P0000 here for surgical management of fibroid uterus.   Proposed surgery: myomectomy via laparotomy  Past Medical History:  Diagnosis Date  . Anemia   . Bronchitis    h/o, seasonal   History reviewed. No pertinent surgical history. OB History    Gravida  0   Para  0   Term  0   Preterm  0   AB  0   Living  0     SAB  0   TAB  0   Ectopic  0   Multiple  0   Live Births  0          Patient denies any cervical dysplasia or STIs. Medications Prior to Admission  Medication Sig Dispense Refill Last Dose  . Fe Fum-FePoly-FA-Vit C-Vit B3 (INTEGRA F) 125-1 MG CAPS Take 1 capsule by mouth daily. 30 capsule 3 Past Week at Unknown time  . megestrol (MEGACE) 40 MG tablet Take 2 tablets (80 mg total) by mouth 2 (two) times daily. Can increase to two tablets twice a day in the event of heavy bleeding 60 tablet 5 Past Week at Unknown time  . naproxen sodium (ALEVE) 220 MG tablet Take 440 mg by mouth daily as needed (pain/cramps).    Past Month at Unknown time  . tranexamic acid (LYSTEDA) 650 MG TABS tablet Take 2 tablets (1,300 mg total) by mouth 3 (three) times daily. Take during menses for a maximum of five days 30 tablet 2 Past Month at Unknown time  . albuterol (PROVENTIL HFA;VENTOLIN HFA) 108 (90 Base) MCG/ACT inhaler Inhale 2 puffs into the lungs every 4 (four) hours as needed for wheezing or shortness of breath. (Patient not taking: Reported on 04/23/2019) 1 Inhaler 1   . ferrous sulfate (FERROUSUL) 325 (65 FE) MG tablet Take 1 tablet (325 mg total) by mouth 2 (two) times daily. (Patient not taking: Reported on 04/23/2019) 60 tablet 1   . megestrol (MEGACE) 40 MG tablet Take 1 tablet (40 mg total) by mouth 2 (two) times daily. (Patient not taking: Reported on 06/11/2019) 60 tablet 3 Not Taking at Unknown time    Allergies  Allergen Reactions  . Amoxicillin Rash    Vaginal swelling id it involve swelling of the  face/tongue/throat, SOB, or low BP? No Did it involve sudden or severe rash/hives, skin peeling, or any reaction on the inside of your mouth or nose? Yes Did you need to seek medical attention at a hospital or doctor's office? No When did it last happen?34 yrs old If all above answers are "NO", may proceed with cephalosporin use.   Social History:   reports that she has never smoked. She has never used smokeless tobacco. She reports that she does not drink alcohol or use drugs. Family History  Problem Relation Age of Onset  . Cancer Paternal Grandfather        pancreatic  . Diabetes Maternal Grandmother   . Cancer Maternal Grandfather        lung  . Hypertension Mother     Review of Systems: Noncontributory  PHYSICAL EXAM: Blood pressure 128/83, pulse (!) 108, temperature 98.2 F (36.8 C), resp. rate 18, height 5\' 5"  (1.651 m), weight 77 kg, SpO2 99 %. General appearance - alert, well appearing, and in no distress Chest - clear to auscultation, no wheezes, rales or rhonchi, symmetric air entry Heart - normal rate and regular rhythm Abdomen - soft, nontender,  nondistended, no masses or organomegaly Pelvic - examination not indicated Extremities - peripheral pulses normal, no pedal edema, no clubbing or cyanosis  Labs: Results for orders placed or performed during the hospital encounter of 06/19/19 (from the past 336 hour(s))  Pregnancy, urine POC   Collection Time: 06/19/19  9:12 AM  Result Value Ref Range   Preg Test, Ur NEGATIVE NEGATIVE  Results for orders placed or performed during the hospital encounter of 06/15/19 (from the past 336 hour(s))  SARS CORONAVIRUS 2 (TAT 6-24 HRS) Nasopharyngeal Nasopharyngeal Swab   Collection Time: 06/15/19 12:36 PM   Specimen: Nasopharyngeal Swab  Result Value Ref Range   SARS Coronavirus 2 NEGATIVE NEGATIVE  Results for orders placed or performed during the hospital encounter of 06/15/19 (from the past 336 hour(s))  Type and screen  [place]   Collection Time: 06/15/19  8:28 AM  Result Value Ref Range   ABO/RH(D) B POS    Antibody Screen NEG    Sample Expiration 06/29/2019,2359    Extend sample reason      NO TRANSFUSIONS OR PREGNANCY IN THE PAST 3 MONTHS Performed at [facility], [address], [place], [place] [address]   CBC   Collection Time: 06/15/19  8:44 AM  Result Value Ref Range   WBC 5.1 4.0 - 10.5 K/uL   RBC 5.08 3.87 - 5.11 MIL/uL   Hemoglobin 12.9 12.0 - 15.0 g/dL   HCT 40.7 36.0 - 46.0 %   MCV 80.1 80.0 - 100.0 fL   MCH 25.4 (L) 26.0 - 34.0 pg   MCHC 31.7 30.0 - 36.0 g/dL   RDW 16.1 (H) 11.5 - 15.5 %   Platelets 364 150 - 400 K/uL   nRBC 0.0 0.0 - 0.2 %    Imaging Studies: No results found.  Assessment: Patient Active Problem List   Diagnosis Date Noted  . Post-operative state 06/19/2019  . Fibroid uterus 02/24/2019  . Menorrhagia 02/24/2019  . Acute blood loss anemia 02/23/2019    Plan: Patient will undergo surgical management with myomectomy via laparotomy.   The risks of surgery were discussed in detail with the patient including but not limited to: bleeding which may require transfusion or reoperation; infection which may require antibiotics; injury to surrounding organs which may involve bowel, bladder, ureters ; need for additional procedures including laparoscopy or laparotomy; thromboembolic phenomenon, surgical site problems and other postoperative/anesthesia complications. Likelihood of success in alleviating the patient's condition was discussed. Routine postoperative instructions will be reviewed with the patient and her family in detail after surgery.  The patient concurred with the proposed plan, giving informed written consent for the surgery.  Patient has been NPO since last night she will remain NPO for procedure.  Anesthesia and OR aware.  Preoperative prophylactic antibiotics and SCDs ordered on call to the OR.  To OR when ready.  Treyson Axel L.  Ihor Dow, M.D., [facility] 06/19/2019 10:55 AM

## 2019-06-19 NOTE — Anesthesia Postprocedure Evaluation (Signed)
Anesthesia Post Note  Patient: Carmen Hays  Procedure(s) Performed: ABDOMINAL MYOMECTOMY (N/A Abdomen)     Patient location during evaluation: PACU Anesthesia Type: General Level of consciousness: sedated Pain management: pain level controlled Vital Signs Assessment: post-procedure vital signs reviewed and stable Respiratory status: spontaneous breathing and respiratory function stable Cardiovascular status: stable Postop Assessment: no apparent nausea or vomiting Anesthetic complications: no    Last Vitals:  Vitals:   06/19/19 1430 06/19/19 1445  BP: 115/86 123/89  Pulse: (!) 114 93  Resp: 16 14  Temp:    SpO2: 100% 99%    Last Pain:  Vitals:   06/19/19 1429  PainSc: 0-No pain                 Macauley Mossberg DANIEL

## 2019-06-19 NOTE — Anesthesia Preprocedure Evaluation (Addendum)
Anesthesia Evaluation  Patient identified by MRN, date of birth, ID band Patient awake    Reviewed: Allergy & Precautions, NPO status , Patient's Chart, lab work & pertinent test results  History of Anesthesia Complications Negative for: history of anesthetic complications  Airway Mallampati: II  TM Distance: >3 FB     Dental no notable dental hx. (+) Dental Advisory Given   Pulmonary neg pulmonary ROS,    Pulmonary exam normal        Cardiovascular negative cardio ROS Normal cardiovascular exam     Neuro/Psych negative neurological ROS     GI/Hepatic negative GI ROS, Neg liver ROS,   Endo/Other  negative endocrine ROS  Renal/GU negative Renal ROS     Musculoskeletal negative musculoskeletal ROS (+)   Abdominal   Peds  Hematology negative hematology ROS (+)   Anesthesia Other Findings Day of surgery medications reviewed with the patient.  Reproductive/Obstetrics                            Anesthesia Physical Anesthesia Plan  ASA: II  Anesthesia Plan: General   Post-op Pain Management:    Induction: Intravenous  PONV Risk Score and Plan: 4 or greater and Ondansetron, Dexamethasone, Scopolamine patch - Pre-op, Midazolam and Treatment may vary due to age or medical condition  Airway Management Planned: Oral ETT  Additional Equipment:   Intra-op Plan:   Post-operative Plan: Extubation in OR  Informed Consent: I have reviewed the patients History and Physical, chart, labs and discussed the procedure including the risks, benefits and alternatives for the proposed anesthesia with the patient or authorized representative who has indicated his/her understanding and acceptance.     Dental advisory given  Plan Discussed with: Anesthesiologist and CRNA  Anesthesia Plan Comments:        Anesthesia Quick Evaluation

## 2019-06-19 NOTE — Anesthesia Procedure Notes (Signed)
Anesthesia Regional Block: TAP block   Pre-Anesthetic Checklist: ,, timeout performed, Correct Patient, Correct Site, Correct Laterality, Correct Procedure, Correct Position, site marked, Risks and benefits discussed,  Surgical consent,  Pre-op evaluation,  At surgeon's request and post-op pain management  Laterality: Left and Right  Prep: chloraprep       Needles:  Injection technique: Single-shot  Needle Type: Echogenic Stimulator Needle     Needle Length: 10cm  Needle Gauge: 21     Additional Needles:   Narrative:  Start time: 06/19/2019 11:04 AM End time: 06/19/2019 11:16 AM Injection made incrementally with aspirations every 5 mL.  Performed by: Personally

## 2019-06-19 NOTE — Anesthesia Procedure Notes (Addendum)
Procedure Name: Intubation Date/Time: 06/19/2019 11:31 AM Performed by: Amadeo Garnet, CRNA Pre-anesthesia Checklist: Patient identified, Emergency Drugs available, Suction available and Patient being monitored Patient Re-evaluated:Patient Re-evaluated prior to induction Oxygen Delivery Method: Circle system utilized Preoxygenation: Pre-oxygenation with 100% oxygen Induction Type: IV induction Ventilation: Mask ventilation without difficulty Laryngoscope Size: Mac and 3 Grade View: Grade I Tube type: Oral Tube size: 7.0 mm Number of attempts: 1 Airway Equipment and Method: Stylet and Oral airway Placement Confirmation: ETT inserted through vocal cords under direct vision,  positive ETCO2 and breath sounds checked- equal and bilateral Secured at: 22 cm Tube secured with: Tape Dental Injury: Teeth and Oropharynx as per pre-operative assessment  Comments: Intubation by Clyde Lundborg

## 2019-06-19 NOTE — Transfer of Care (Signed)
Immediate Anesthesia Transfer of Care Note  Patient: Carmen Hays  Procedure(s) Performed: ABDOMINAL MYOMECTOMY (N/A Abdomen)  Patient Location: PACU  Anesthesia Type:GA combined with regional for post-op pain  Level of Consciousness: drowsy  Airway & Oxygen Therapy: Patient Spontanous Breathing and Patient connected to face mask oxygen  Post-op Assessment: Report given to RN, Post -op Vital signs reviewed and stable and Patient moving all extremities  Post vital signs: Reviewed and stable  Last Vitals:  Vitals Value Taken Time  BP 113/98 06/19/19 1350  Temp    Pulse 99 06/19/19 1351  Resp 16 06/19/19 1351  SpO2 100 % 06/19/19 1351  Vitals shown include unvalidated device data.  Last Pain:  Vitals:   06/19/19 0925  PainSc: 6       Patients Stated Pain Goal: 2 ([id number])  Complications: No apparent anesthesia complications

## 2019-06-20 LAB — CBC
HCT: 31.1 % — ABNORMAL LOW (ref 36.0–46.0)
Hemoglobin: 10 g/dL — ABNORMAL LOW (ref 12.0–15.0)
MCH: 25.4 pg — ABNORMAL LOW (ref 26.0–34.0)
MCHC: 32.2 g/dL (ref 30.0–36.0)
MCV: 79.1 fL — ABNORMAL LOW (ref 80.0–100.0)
Platelets: 363 10*3/uL (ref 150–400)
RBC: 3.93 MIL/uL (ref 3.87–5.11)
RDW: 15.2 % (ref 11.5–15.5)
WBC: 11.9 10*3/uL — ABNORMAL HIGH (ref 4.0–10.5)
nRBC: 0 % (ref 0.0–0.2)

## 2019-06-20 MED ORDER — OXYCODONE-ACETAMINOPHEN 5-325 MG PO TABS: 1.0000 | ORAL_TABLET | ORAL | 0 refills | Status: DC | PRN

## 2019-06-20 MED ORDER — IBUPROFEN 800 MG PO TABS: 800.0000 mg | ORAL_TABLET | Freq: Four times a day (QID) | ORAL | 0 refills | Status: DC

## 2019-06-20 MED ORDER — PANTOPRAZOLE SODIUM 40 MG PO TBEC: 40.0000 mg | DELAYED_RELEASE_TABLET | Freq: Every day | ORAL | Status: DC

## 2019-06-20 MED ORDER — DOCUSATE SODIUM 100 MG PO CAPS: 100.0000 mg | ORAL_CAPSULE | Freq: Two times a day (BID) | ORAL | 0 refills | Status: DC

## 2019-06-20 NOTE — Discharge Instructions (Signed)
Myomectomy, Care After This sheet gives you information about how to care for yourself after your procedure. Your health care provider may also give you more specific instructions. If you have problems or questions, contact your health care provider. What can I expect after the procedure? After the procedure, it is common to have:  Pain in your abdomen, especially at the incision areas. You will be given pain medicine to control the pain.  Tiredness. This is a normal part of the recovery process. Your energy level will return to normal over the coming weeks.  Vaginal bleeding. This is normal and will stop in the coming weeks.  Constipation. Recovery time from this procedure will depend on the type of procedure you had and your general overall health prior to the procedure. Follow these instructions at home: Medicines  Take over-the-counter and prescription medicines only as told by your health care provider.  Do not take aspirin because it can cause bleeding.  If you were prescribed an antibiotic medicine, use it as told by your health care provider. Do not stop using the antibiotic even if you start to feel better.  Do not drive or use heavy machinery while taking prescription pain medicine.  Do not drink alcohol while taking prescription pain medicine. Incision care   Follow instructions from your health care provider about how to take care of any incisions. Make sure you: ? Wash your hands with soap and water before you change your bandage (dressing). If soap and water are not available, use hand sanitizer. ? Change your dressing as told by your health care provider. ? Leave stitches (sutures), skin glue, or adhesive strips in place. These skin closures may need to stay in place for 2 weeks or longer. If adhesive strip edges start to loosen and curl up, you may trim the loose edges. Do not remove adhesive strips completely unless your health care provider tells you to do  that.  Check your incision areas every day for signs of infection. Check for: ? Redness, swelling, or pain. ? Fluid or blood. ? Warmth. ? Pus or a bad smell.  Do not take baths, swim, or use a hot tub until your health care provider approves. Take showers as directed by your health care provider. Activity  Return to your normal activities as told by your health care provider. Ask your health care provider what activities are safe for you.  Do not do activities that require a lot of effort until your health care provider says it is okay.  Do not lift anything that is heavier than 15 lb (6.8 kg) until your health care provider says that it is safe.  Do not douche, use tampons, or have sexual intercourse until your health care provider approves.  Walk daily but take frequent rest breaks if you tire easily.  Continue to practice deep breathing and coughing. If it hurts to cough, try holding a pillow against your belly as you cough.  Do not drive until your health care provider approves. General instructions  To prevent or treat constipation while you are taking prescription pain medicine, your health care provider may recommend that you: ? Drink enough fluid to keep your urine clear or pale yellow. ? Take over-the-counter or prescription medicines. ? Eat foods that are high in fiber, such as fresh fruits and vegetables, whole grains, and beans. ? Limit foods that are high in fat and processed sugars, such as fried and sweet foods.  Take your temperature twice a day   and write it down. If you develop a fever, this may be a sign that you have an infection.  Do not drink alcohol.  Have someone help you at home for 1 week or until you can do your own household activities.  Keep all follow-up visits as told by your health care provider. This is important. Contact a health care provider if:  You have a fever.  You have increasing abdominal pain that is not relieved with  medicine.  You have nausea, vomiting, or diarrhea.  You have pain when you urinate or you have blood in your urine.  You have a rash on your body.  You have pain or redness where your IV access tube was inserted.  You have redness, swelling, or pain around an incision.  You have fluid or blood coming from an incision.  An incision feels warm to the touch.  You have pus or a bad smell coming from an incision. Get help right away if:  You have weakness or light-headedness.  You have pain, swelling, or redness in your legs.  You have chest pain.  You faint.  You have shortness of breath.  You have heavy vaginal bleeding.  You have an incision that is opening up. Summary  Recovery time from this procedure will depend on the type of procedure you had and your general overall health prior to the procedure.  If you were prescribed an antibiotic medicine, use it as told by your health care provider. Do not stop using the antibiotic even if you start to feel better.  Do not douche, use tampons, or have sexual intercourse until your health care provider approves.  Return to your normal activities as told by your health care provider. Ask your health care provider what activities are safe for you. This information is not intended to replace advice given to you by your health care provider. Make sure you discuss any questions you have with your health care provider. Document Revised: 02/11/2017 Document Reviewed: 04/01/2016 Elsevier Patient Education  2020 [employer]  

## 2019-06-20 NOTE — Discharge Summary (Signed)
Physician Discharge Summary  Patient ID: Carmen Hays MRN: [id number] DOB/AGE: 04-12-1985 34 y.o.  Admit date: 06/19/2019 Discharge date: 06/20/2019  Admission Diagnoses: AUB; uterine fibroids  Discharge Diagnoses:  Principal Problem:   Fibroid uterus Active Problems:   Acute blood loss anemia   Menorrhagia   Post-operative state  Discharged Condition: good  Hospital Course: Patient had an uncomplicated surgery; for further details of this surgery, please refer to the operative note. Furthermore, the patient had an uncomplicated postoperative course.  By time of discharge, her pain was controlled on oral pain medications; she was ambulating, voiding without difficulty, tolerating regular diet and passing flatus.  She was deemed stable for discharge to home.    Significant Diagnostic Studies: labs: CBC  Treatments: surgery: Laparotomy with myomectomy  Discharge Exam: Blood pressure 117/84, pulse 85, temperature 99.6 F (37.6 C), temperature source Oral, resp. rate 16, height 5\' 5"  (1.651 m), weight 77 kg, SpO2 100 %. General appearance: alert and no distress Resp: clear to auscultation bilaterally Cardio: regular rate and rhythm, S1, S2 normal, no murmur, click, rub or gallop GI: soft, non-tender; bowel sounds normal; no masses,  no organomegaly Extremities: extremities normal, atraumatic, no cyanosis or edema Incision/Wound:dressing clean and dry. Pressure dressing removed.    CBC Latest Ref Rng & Units 06/20/2019 06/15/2019 03/26/2019  WBC 4.0 - 10.5 K/uL 11.9(H) 5.1 5.1  Hemoglobin 12.0 - 15.0 g/dL 10.0(L) 12.9 9.6(L)  Hematocrit 36.0 - 46.0 % 31.1(L) 40.7 32.4(L)  Platelets 150 - 400 K/uL 363 364 344   Disposition: Discharge disposition: 01-Home or Self Care       Discharge Instructions    Call MD for:  difficulty breathing, headache or visual disturbances   Complete by: As directed    Call MD for:  extreme fatigue   Complete by: As directed    Call MD for:  hives    Complete by: As directed    Call MD for:  persistant dizziness or light-headedness   Complete by: As directed    Call MD for:  persistant nausea and vomiting   Complete by: As directed    Call MD for:  redness, tenderness, or signs of infection (pain, swelling, redness, odor or green/yellow discharge around incision site)   Complete by: As directed    Call MD for:  severe uncontrolled pain   Complete by: As directed    Call MD for:  temperature >100.4   Complete by: As directed    Diet general   Complete by: As directed    Driving Restrictions   Complete by: As directed    No driving for 2 weeks   Increase activity slowly   Complete by: As directed    Lifting restrictions   Complete by: As directed    No heavy lifting, bending or stooping.   Other Restrictions   Complete by: As directed    No tub baths. May shower.   Sexual Activity Restrictions   Complete by: As directed    No sexual activity for 6 weeks     Allergies as of 06/20/2019      Reactions   Amoxicillin Rash   Vaginal swelling id it involve swelling of the face/tongue/throat, SOB, or low BP? No Did it involve sudden or severe rash/hives, skin peeling, or any reaction on the inside of your mouth or nose? Yes Did you need to seek medical attention at a hospital or doctor's office? No When did it last happen?34 yrs old If all above  answers are "NO", may proceed with cephalosporin use.      Medication List    TAKE these medications   albuterol 108 (90 Base) MCG/ACT inhaler Commonly known as: VENTOLIN HFA Inhale 2 puffs into the lungs every 4 (four) hours as needed for wheezing or shortness of breath.   docusate sodium 100 MG capsule Commonly known as: COLACE Take 1 capsule (100 mg total) by mouth 2 (two) times daily.   ibuprofen 800 MG tablet Commonly known as: ADVIL Take 1 tablet (800 mg total) by mouth every 6 (six) hours.   oxyCODONE-acetaminophen 5-325 MG tablet Commonly known as:  PERCOCET/ROXICET Take 1-2 tablets by mouth every 4 (four) hours as needed for moderate pain.      Follow-up Information    [facility] Follow up in 2 week(s).   Specialty: Internal Medicine Contact information: [address] [place] [phone]          Signed: Lavonia Drafts 06/20/2019, 2:13 PM

## 2019-06-20 NOTE — Progress Notes (Signed)
Patient discharged with mother to home. Discharge instructions reviewed, patient expressed understanding. All questions answered. Discharge packet and medicines received. Patient took all belongings with her.

## 2019-06-21 LAB — SURGICAL PATHOLOGY

## 2019-07-04 ENCOUNTER — Other Ambulatory Visit: Payer: Self-pay

## 2019-07-04 ENCOUNTER — Ambulatory Visit (INDEPENDENT_AMBULATORY_CARE_PROVIDER_SITE_OTHER): Payer: Self-pay | Admitting: Obstetrics & Gynecology

## 2019-07-04 ENCOUNTER — Encounter: Payer: Self-pay | Admitting: Obstetrics & Gynecology

## 2019-07-04 VITALS — BP 111/59 | HR 96 | Ht 65.0 in | Wt 166.0 lb

## 2019-07-04 DIAGNOSIS — Z9889 Other specified postprocedural states: Secondary | ICD-10-CM

## 2019-07-04 NOTE — Progress Notes (Signed)
Post op( two weeks) abd. myomectomy

## 2019-07-04 NOTE — Progress Notes (Signed)
History:  34 y.o.LMP here today for 2 week post op check.Pt is s/pmyomectomy on 06/19/2019.  Pt reports that she is doing well. She is eating and passing stools without difficulty.   She had some heavy bleeding last week assoc with cramping and suspects that this was her menses. She is unsure but, that pain has resolved. She reports walking daily.    The following portions of the patient's history were reviewed and updated as appropriate: allergies, current medications, past family history, past medical history, past social history, past surgical history and problem list.  Review of Systems:  Pertinent items are noted in HPI.    Objective:  Physical Exam BP (!) 111/59   Pulse 96   Ht 5\' 5"  (1.651 m)   Wt 166 lb (75.3 kg)   LMP  (LMP Unknown)   BMI 27.62 kg/m   CONSTITUTIONAL: Well-developed, well-nourished female in no acute distress.  HENT:  Normocephalic, atraumatic EYES: Conjunctivae and EOM are normal. No scleral icterus.  NECK: Normal range of motion SKIN: Skin is warm and dry. No rash noted. Not diaphoretic.No pallor. [place]: Alert and oriented to person, place, and time. Normal coordination.  Abd: Soft, nontender and nondistended; her port sites are healing well. .  Pelvic: deferred  Labs and Imaging Surg path FINAL MICROSCOPIC DIAGNOSIS:   A. UTERINE FIBROIDS, MYOMECTOMY:  - Benign leiomyomata, largest measuring 13 cm, with degenerative changes  - No evidence of malignancy    Assessment & Plan:  2 week post op check following myomectomy  Doing well  Reviewed her surg path.   Reviewed post op instructions and activities  Gradual increase in activities  F/u in 4 weeks or sooner prn  Reviewed no intercourse for 6 weeks post op  Recommended no pregnancies for 6-9 months.   Reiterated to pt that she will need a c-section at 36 weeks should she get pregnant!!! Pt expressed understanding.    All questions answered.   Delorse Shane L. Harraway-Smith, M.D., [place] June

## 2019-07-04 NOTE — Patient Instructions (Signed)
Myomectomy, Care After This sheet gives you information about how to care for yourself after your procedure. Your health care provider may also give you more specific instructions. If you have problems or questions, contact your health care provider. What can I expect after the procedure? After the procedure, it is common to have:  Pain in your abdomen, especially at the incision areas. You will be given pain medicine to control the pain.  Tiredness. This is a normal part of the recovery process. Your energy level will return to normal over the coming weeks.  Vaginal bleeding. This is normal and will stop in the coming weeks.  Constipation. Recovery time from this procedure will depend on the type of procedure you had and your general overall health prior to the procedure. Follow these instructions at home: Medicines  Take over-the-counter and prescription medicines only as told by your health care provider.  Do not take aspirin because it can cause bleeding.  If you were prescribed an antibiotic medicine, use it as told by your health care provider. Do not stop using the antibiotic even if you start to feel better.  Do not drive or use heavy machinery while taking prescription pain medicine.  Do not drink alcohol while taking prescription pain medicine. Incision care   Follow instructions from your health care provider about how to take care of any incisions. Make sure you: ? Wash your hands with soap and water before you change your bandage (dressing). If soap and water are not available, use hand sanitizer. ? Change your dressing as told by your health care provider. ? Leave stitches (sutures), skin glue, or adhesive strips in place. These skin closures may need to stay in place for 2 weeks or longer. If adhesive strip edges start to loosen and curl up, you may trim the loose edges. Do not remove adhesive strips completely unless your health care provider tells you to do  that.  Check your incision areas every day for signs of infection. Check for: ? Redness, swelling, or pain. ? Fluid or blood. ? Warmth. ? Pus or a bad smell.  Do not take baths, swim, or use a hot tub until your health care provider approves. Take showers as directed by your health care provider. Activity  Return to your normal activities as told by your health care provider. Ask your health care provider what activities are safe for you.  Do not do activities that require a lot of effort until your health care provider says it is okay.  Do not lift anything that is heavier than 15 lb (6.8 kg) until your health care provider says that it is safe.  Do not douche, use tampons, or have sexual intercourse until your health care provider approves.  Walk daily but take frequent rest breaks if you tire easily.  Continue to practice deep breathing and coughing. If it hurts to cough, try holding a pillow against your belly as you cough.  Do not drive until your health care provider approves. General instructions  To prevent or treat constipation while you are taking prescription pain medicine, your health care provider may recommend that you: ? Drink enough fluid to keep your urine clear or pale yellow. ? Take over-the-counter or prescription medicines. ? Eat foods that are high in fiber, such as fresh fruits and vegetables, whole grains, and beans. ? Limit foods that are high in fat and processed sugars, such as fried and sweet foods.  Take your temperature twice a day   and write it down. If you develop a fever, this may be a sign that you have an infection.  Do not drink alcohol.  Have someone help you at home for 1 week or until you can do your own household activities.  Keep all follow-up visits as told by your health care provider. This is important. Contact a health care provider if:  You have a fever.  You have increasing abdominal pain that is not relieved with  medicine.  You have nausea, vomiting, or diarrhea.  You have pain when you urinate or you have blood in your urine.  You have a rash on your body.  You have pain or redness where your IV access tube was inserted.  You have redness, swelling, or pain around an incision.  You have fluid or blood coming from an incision.  An incision feels warm to the touch.  You have pus or a bad smell coming from an incision. Get help right away if:  You have weakness or light-headedness.  You have pain, swelling, or redness in your legs.  You have chest pain.  You faint.  You have shortness of breath.  You have heavy vaginal bleeding.  You have an incision that is opening up. Summary  Recovery time from this procedure will depend on the type of procedure you had and your general overall health prior to the procedure.  If you were prescribed an antibiotic medicine, use it as told by your health care provider. Do not stop using the antibiotic even if you start to feel better.  Do not douche, use tampons, or have sexual intercourse until your health care provider approves.  Return to your normal activities as told by your health care provider. Ask your health care provider what activities are safe for you. This information is not intended to replace advice given to you by your health care provider. Make sure you discuss any questions you have with your health care provider. Document Revised: 02/11/2017 Document Reviewed: 04/01/2016 Elsevier Patient Education  2020 [employer]  

## 2019-07-08 ENCOUNTER — Other Ambulatory Visit: Payer: Self-pay

## 2019-07-08 ENCOUNTER — Emergency Department (HOSPITAL_COMMUNITY)
Admission: EM | Admit: 2019-07-08 | Discharge: 2019-07-08 | Disposition: A | Discharge status: Home / Self Care | Payer: Medicaid Other | Attending: Emergency Medicine | Admitting: Emergency Medicine

## 2019-07-08 ENCOUNTER — Encounter (HOSPITAL_COMMUNITY): Payer: Self-pay | Admitting: Emergency Medicine

## 2019-07-08 DIAGNOSIS — T8130XA Disruption of wound, unspecified, initial encounter: Secondary | ICD-10-CM

## 2019-07-08 DIAGNOSIS — Y738 Miscellaneous gastroenterology and urology devices associated with adverse incidents, not elsewhere classified: Secondary | ICD-10-CM | POA: Insufficient documentation

## 2019-07-08 LAB — CBC WITH DIFFERENTIAL/PLATELET
Abs Immature Granulocytes: 0.01 10*3/uL (ref 0.00–0.07)
Basophils Absolute: 0.1 10*3/uL (ref 0.0–0.1)
Basophils Relative: 2 %
Eosinophils Absolute: 0.1 10*3/uL (ref 0.0–0.5)
Eosinophils Relative: 3 %
HCT: 37 % (ref 36.0–46.0)
Hemoglobin: 11.7 g/dL — ABNORMAL LOW (ref 12.0–15.0)
Immature Granulocytes: 0 %
Lymphocytes Relative: 36 %
Lymphs Abs: 1.7 10*3/uL (ref 0.7–4.0)
MCH: 25.2 pg — ABNORMAL LOW (ref 26.0–34.0)
MCHC: 31.6 g/dL (ref 30.0–36.0)
MCV: 79.6 fL — ABNORMAL LOW (ref 80.0–100.0)
Monocytes Absolute: 0.3 10*3/uL (ref 0.1–1.0)
Monocytes Relative: 5 %
Neutro Abs: 2.7 10*3/uL (ref 1.7–7.7)
Neutrophils Relative %: 54 %
Platelets: 508 10*3/uL — ABNORMAL HIGH (ref 150–400)
RBC: 4.65 MIL/uL (ref 3.87–5.11)
RDW: 15.3 % (ref 11.5–15.5)
WBC: 4.9 10*3/uL (ref 4.0–10.5)
nRBC: 0 % (ref 0.0–0.2)

## 2019-07-08 LAB — COMPREHENSIVE METABOLIC PANEL
ALT: 16 U/L (ref 0–44)
AST: 14 U/L — ABNORMAL LOW (ref 15–41)
Albumin: 3.5 g/dL (ref 3.5–5.0)
Alkaline Phosphatase: 85 U/L (ref 38–126)
Anion gap: 8 (ref 5–15)
BUN: 5 mg/dL — ABNORMAL LOW (ref 6–20)
CO2: 24 mmol/L (ref 22–32)
Calcium: 9.4 mg/dL (ref 8.9–10.3)
Chloride: 107 mmol/L (ref 98–111)
Creatinine, Ser: 0.62 mg/dL (ref 0.44–1.00)
GFR calc Af Amer: >60 mL/min (ref >60)
GFR calc non Af Amer: >60 mL/min (ref >60)
Glucose, Bld: 90 mg/dL (ref 70–99)
Potassium: 3.8 mmol/L (ref 3.5–5.1)
Sodium: 139 mmol/L (ref 135–145)
Total Bilirubin: 0.6 mg/dL (ref 0.3–1.2)
Total Protein: 7.5 g/dL (ref 6.5–8.1)

## 2019-07-08 NOTE — ED Notes (Signed)
EDP at Bedside 

## 2019-07-08 NOTE — ED Triage Notes (Addendum)
Pt st's she had lower abd surg on 4/6.  Pt st's incision started to open up yesterday with small amount of bleeding  Pt also st's she has noticed some yellow drainage coming from incision

## 2019-07-08 NOTE — ED Provider Notes (Signed)
[facility] Provider Note   CSN: [id number] Arrival date & time: 07/08/19  1611     History Chief Complaint  Patient presents with  . Post-op Problem    Carmen Hays is a 34 y.o. female.  The history is provided by the patient and medical records. No language interpreter was used.     34 year old female previously had laparotomy with myomectomy on 06/19/2019 due to uterine fibroid and abnormal uterine bleeding.  She had a fairly uncomplicated surgery course.  She is here today due to lower abdominal discomfort.  Patient report she believes she may have a wire her gene a bit prematurely and it was a bit tight and this morning she felt pain around her incision site.  She described pain as a stinging burning sensation and noticed that her wound appears to be open.  She noticed small amount of clear drainage coming from the site along with a very small amount of bleeding.  Rates pain as 5 out of 10.  No associated fever no nausea or vomiting no bowel bladder changes and no rash.  No specific treatment tried.  Past Medical History:  Diagnosis Date  . Anemia   . Bronchitis    h/o, seasonal    Patient Active Problem List   Diagnosis Date Noted  . Post-operative state 06/19/2019  . Fibroid uterus 02/24/2019  . Menorrhagia 02/24/2019  . Acute blood loss anemia 02/23/2019    Past Surgical History:  Procedure Laterality Date  . MYOMECTOMY N/A 06/19/2019   Procedure: ABDOMINAL MYOMECTOMY;  Surgeon: Lavonia Drafts, MD;  Location: [facility];  Service: Gynecology;  Laterality: N/A;     OB History    Gravida  0   Para  0   Term  0   Preterm  0   AB  0   Living  0     SAB  0   TAB  0   Ectopic  0   Multiple  0   Live Births  0           Family History  Problem Relation Age of Onset  . Cancer Paternal Grandfather        pancreatic  . Diabetes Maternal Grandmother   . Cancer Maternal Grandfather        lung  .  Hypertension Mother     Social History   Tobacco Use  . Smoking status: Never Smoker  . Smokeless tobacco: Never Used  Substance Use Topics  . Alcohol use: No  . Drug use: No    Home Medications Prior to Admission medications   Medication Sig Start Date End Date Taking? Authorizing Provider  albuterol (PROVENTIL HFA;VENTOLIN HFA) 108 (90 Base) MCG/ACT inhaler Inhale 2 puffs into the lungs every 4 (four) hours as needed for wheezing or shortness of breath. Patient not taking: Reported on 04/23/2019 05/05/18   Harrie Foreman, MD  docusate sodium (COLACE) 100 MG capsule Take 1 capsule (100 mg total) by mouth 2 (two) times daily. Patient not taking: Reported on 07/04/2019 06/20/19   Lavonia Drafts, MD  ibuprofen (ADVIL) 800 MG tablet Take 1 tablet (800 mg total) by mouth every 6 (six) hours. 06/20/19   Lavonia Drafts, MD  oxyCODONE-acetaminophen (PERCOCET/ROXICET) 5-325 MG tablet Take 1-2 tablets by mouth every 4 (four) hours as needed for moderate pain. 06/20/19   Lavonia Drafts, MD    Allergies    Amoxicillin  Review of Systems   Review of Systems  Constitutional: Negative for fever.  Skin: Positive for wound.    Physical Exam Updated Vital Signs BP 122/89 (BP Location: Left Arm)   Pulse 90   Temp 98.5 F (36.9 C) (Oral)   Resp 18   Ht 5\' 5"  (1.651 m)   Wt 75.3 kg   LMP  (LMP Unknown)   SpO2 100%   BMI 27.62 kg/m   Physical Exam Vitals and nursing note reviewed.  Constitutional:      General: She is not in acute distress.    Appearance: She is well-developed.  HENT:     Head: Atraumatic.  Eyes:     Conjunctiva/sclera: Conjunctivae normal.  Cardiovascular:     Rate and Rhythm: Normal rate and regular rhythm.     Pulses: Normal pulses.     Heart sounds: Normal heart sounds.  Pulmonary:     Effort: Pulmonary effort is normal.     Breath sounds: Normal breath sounds.  Abdominal:     Palpations: Abdomen is soft.     Comments: Recent  abdominal surgical scar along the lower pannus of the abdomen with several small area of slight dehiscence exposing approximately 2 mm of subcutaneous tissue without any surrounding skin erythema.  Minimal tenderness to palpation.  No fluctuant.  Musculoskeletal:     Cervical back: Neck supple.  Skin:    Findings: No rash.  Neurological:     Mental Status: She is alert and oriented to person, place, and time.  Psychiatric:        Mood and Affect: Mood normal.     ED Results / Procedures / Treatments   Labs (all labs ordered are listed, but only abnormal results are displayed) Labs Reviewed  CBC WITH DIFFERENTIAL/PLATELET - Abnormal; Notable for the following components:      Result Value   Hemoglobin 11.7 (*)    MCV 79.6 (*)    MCH 25.2 (*)    Platelets 508 (*)    All other components within normal limits  COMPREHENSIVE METABOLIC PANEL - Abnormal; Notable for the following components:   BUN 5 (*)    AST 14 (*)    All other components within normal limits    EKG None  Radiology No results found.  Procedures Procedures (including critical care time)  Medications Ordered in ED Medications - No data to display  ED Course  I have reviewed the triage vital signs and the nursing notes.  Pertinent labs & imaging results that were available during my care of the patient were reviewed by me and considered in my medical decision making (see chart for details).    MDM Rules/Calculators/A&P                      BP 122/89 (BP Location: Left Arm)   Pulse 90   Temp 98.5 F (36.9 C) (Oral)   Resp 18   Ht 5\' 5"  (1.651 m)   Wt 75.3 kg   LMP  (LMP Unknown)   SpO2 100%   BMI 27.62 kg/m   Final Clinical Impression(s) / ED Diagnoses Final diagnoses:  Wound dehiscence    Rx / DC Orders ED Discharge Orders    None     9:09 PM Patient recently had myomectomy approximately 20 days ago here with complaints of irritation along her surgical site.  She does have some very  minimal dehiscence of the skin without any signs of infection.  Appropriate wound care was discussed along with return precaution.  She is otherwise stable for discharge.   Domenic Moras, PA-C 07/08/19 2113    Deno Etienne, DO 07/08/19 2234

## 2019-07-08 NOTE — Discharge Instructions (Signed)
You are experiencing some minor wound dehiscence.  Please clean the site with gentle soap and water twice daily and apply Neosporin and dressing for skin protection.  Follow-up with your doctor for further care.  If you notice any signs of infection do not hesitate to return for further management.

## 2019-07-08 NOTE — ED Notes (Signed)
Patient verbalizes understanding of discharge instructions. Opportunity for questioning and answers were provided. Armband removed by staff, pt discharged from ED ambulatory to home.  

## 2019-07-13 ENCOUNTER — Telehealth: Payer: Self-pay

## 2019-07-13 ENCOUNTER — Ambulatory Visit: Payer: Medicaid Other | Attending: Internal Medicine

## 2019-07-13 DIAGNOSIS — Z23 Encounter for immunization: Secondary | ICD-10-CM

## 2019-07-13 NOTE — Telephone Encounter (Signed)
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 Pt states she got sick after taking a flu vaccine.Per Dr. Ihor Dow, we can not provide the letter and the COVID-19 vaccine is recommended and it is different from the flu vaccine.  Aubrey Voong l Shamiracle Gorden, CMA

## 2019-07-13 NOTE — Progress Notes (Signed)
   Covid-19 Vaccination Clinic  Name:  Carmen Hays    MRN: [id number] DOB: [birth year]  07/13/2019  Ms. Carmen Hays was observed post Covid-19 immunization for 15 minutes without incident. She was provided with Vaccine Information Sheet and instruction to access the V-Safe system.   Ms. Carmen Hays was instructed to call 911 with any severe reactions post vaccine: Marland Kitchen Difficulty breathing  . Swelling of face and throat  . A fast heartbeat  . A bad rash all over body  . Dizziness and weakness   Immunizations Administered    Name Date Dose VIS Date Route   Pfizer COVID-19 Vaccine 07/13/2019  1:15 PM 0.3 mL 05/09/2018 Intramuscular   Manufacturer: [employer]   Lot: [id number]   [place]: [id number]

## 2019-08-06 ENCOUNTER — Ambulatory Visit: Payer: Medicaid Other | Attending: Internal Medicine

## 2019-08-06 ENCOUNTER — Other Ambulatory Visit: Payer: Self-pay

## 2019-08-06 ENCOUNTER — Ambulatory Visit (HOSPITAL_BASED_OUTPATIENT_CLINIC_OR_DEPARTMENT_OTHER): Payer: Self-pay | Admitting: Obstetrics & Gynecology

## 2019-08-06 ENCOUNTER — Encounter: Payer: Self-pay | Admitting: Obstetrics & Gynecology

## 2019-08-06 VITALS — BP 102/53 | HR 75 | Ht 65.0 in | Wt 172.0 lb

## 2019-08-06 DIAGNOSIS — Z23 Encounter for immunization: Secondary | ICD-10-CM

## 2019-08-06 DIAGNOSIS — Z9889 Other specified postprocedural states: Secondary | ICD-10-CM

## 2019-08-06 DIAGNOSIS — N390 Urinary tract infection, site not specified: Secondary | ICD-10-CM

## 2019-08-06 DIAGNOSIS — N3001 Acute cystitis with hematuria: Secondary | ICD-10-CM

## 2019-08-06 DIAGNOSIS — L910 Hypertrophic scar: Secondary | ICD-10-CM

## 2019-08-06 DIAGNOSIS — Z48816 Encounter for surgical aftercare following surgery on the genitourinary system: Secondary | ICD-10-CM

## 2019-08-06 MED ORDER — SULFAMETHOXAZOLE-TRIMETHOPRIM 800-160 MG PO TABS: 1.0000 | ORAL_TABLET | Freq: Two times a day (BID) | ORAL | 0 refills | Status: DC

## 2019-08-06 NOTE — Progress Notes (Signed)
History:  34 y.o.LMP here today for 6 week post op check.Pt is s/p myomectomy on 06/19/2019.  Pt reports that she is doing well. She is eating and passing stols without difficulty. Pt reports dysuria with hematuria yesterday. She has been taking OTC pyridium.      The following portions of the patient's history were reviewed and updated as appropriate: allergies, current medications, past family history, past medical history, past social history, past surgical history and problem list.  Review of Systems:  Pertinent items are noted in HPI.    Objective:  Physical Exam BP (!) 102/53   Pulse 75   Ht 5\' 5"  (1.651 m)   Wt 172 lb (78 kg)   LMP 07/21/2019   BMI 28.62 kg/m   CONSTITUTIONAL: Well-developed, well-nourished female in no acute distress.  HENT:  Normocephalic, atraumatic EYES: Conjunctivae and EOM are normal. No scleral icterus.  NECK: Normal range of motion SKIN: Skin is warm and dry. No rash noted. Not diaphoretic.No pallor. [place]: Alert and oriented to person, place, and time. Normal coordination.  Abd: Soft, nontender and nondistended; her port sites are healing well. Suprapubic tenderness. The incision is developing a keloid.   Pelvic: deferred   Assessment & Plan:  6 week post op check following myomectomy  Doing well  Reviewed post op instructions and activities  Gradual increase in activities  F/u in 1 year or sooner prn   All questions answered.   UTI  Bactrim DS 1 po bid x 7 days  Send urine for cx  Keloid- pt wants steroid injection but, not today. She will call to schedule.       Jessaca Philippi L. Harraway-Smith, M.D., Cherlynn June

## 2019-08-06 NOTE — Progress Notes (Signed)
   Covid-19 Vaccination Clinic  Name:  Carmen Hays    MRN: [id number] DOB: [birth year]  08/06/2019  Ms. Carmen Hays was observed post Covid-19 immunization for 15 minutes without incident. She was provided with Vaccine Information Sheet and instruction to access the V-Safe system.   Ms. Carmen Hays was instructed to call 911 with any severe reactions post vaccine: Marland Kitchen Difficulty breathing  . Swelling of face and throat  . A fast heartbeat  . A bad rash all over body  . Dizziness and weakness   Immunizations Administered    Name Date Dose VIS Date Route   Pfizer COVID-19 Vaccine 08/06/2019 11:04 AM 0.3 mL 05/09/2018 Intramuscular   Manufacturer: [place]   Lot: [id number]   [place]: [id number]

## 2019-08-08 LAB — URINE CULTURE

## 2019-08-09 ENCOUNTER — Other Ambulatory Visit: Payer: Self-pay | Admitting: Obstetrics & Gynecology

## 2019-08-09 ENCOUNTER — Telehealth: Payer: Self-pay

## 2019-08-09 NOTE — Telephone Encounter (Signed)
Patient called and states she wants her steroid injection.   Reviewed precious note and Dr. Ihor Dow said she could have her keloid injected with steroids. Patient then made aware that she did have a UTI but patient states "Im already aware of that and have treatment". Made patient aware that I was just making sure she was aware of the results. Kathrene Alu RN

## 2019-08-23 ENCOUNTER — Ambulatory Visit (INDEPENDENT_AMBULATORY_CARE_PROVIDER_SITE_OTHER): Payer: Self-pay | Admitting: Family Medicine

## 2019-08-23 ENCOUNTER — Encounter: Payer: Self-pay | Admitting: Family Medicine

## 2019-08-23 ENCOUNTER — Other Ambulatory Visit: Payer: Self-pay

## 2019-08-23 VITALS — BP 111/70 | HR 67 | Ht 65.0 in | Wt 175.0 lb

## 2019-08-23 DIAGNOSIS — L910 Hypertrophic scar: Secondary | ICD-10-CM

## 2019-08-23 MED ORDER — TRIAMCINOLONE ACETONIDE 10 MG/ML IJ SUSP: 20.0000 mg | Freq: Once | INTRAMUSCULAR | Status: AC

## 2019-08-23 MED ORDER — LIDOCAINE HCL (PF) 1 % IJ SOLN: 2.0000 mL | Freq: Once | INTRAMUSCULAR | Status: AC

## 2019-08-23 NOTE — Progress Notes (Signed)
Patient seen for injection of keloid scar formed secondary to cesarean section, see photo below.   Discussed risks of procedure including bleeding, infection, treatment failure, hypopigmentation. After discussion accepted risks, provided informed written consent.   Skin was prepped with alcohol, due to length of scar a total of 4 cc of 1:1 1% lidocaine:kenalog 10 mg/mL was injected along length of scar.   Patient tolerated procedure well. RTC in approximately 4 weeks for re-evaluation to see if further injections are required.   Annice Needy [facility] 08/23/19

## 2019-08-23 NOTE — Progress Notes (Signed)
Patient presents for injection of keloid scar on abdomen. Kathrene Alu RN

## 2019-08-30 NOTE — Progress Notes (Signed)
Kenalog NDC #[id number] Exp:10-13-2020. Kathrene Alu RN

## 2019-10-16 ENCOUNTER — Other Ambulatory Visit: Payer: Medicaid Other

## 2019-10-16 ENCOUNTER — Other Ambulatory Visit: Payer: Self-pay

## 2019-10-16 DIAGNOSIS — Z20822 Contact with and (suspected) exposure to covid-19: Secondary | ICD-10-CM

## 2019-10-17 LAB — SARS-COV-2, NAA 2 DAY TAT

## 2019-10-17 LAB — NOVEL CORONAVIRUS, NAA: SARS-CoV-2, NAA: NOT DETECTED

## 2019-10-24 ENCOUNTER — Telehealth: Payer: Self-pay

## 2019-10-24 NOTE — Telephone Encounter (Signed)
Patient had myomectomy in April.  Patient states her period started 5 days ago and has been light the whole time. Today her bleeding has become heavy and painful cramping. Patient wondering if she should come in to be seen or if it this is still within normal limits.Kathrene Alu RN

## 2020-02-29 ENCOUNTER — Other Ambulatory Visit: Payer: Self-pay

## 2020-02-29 ENCOUNTER — Ambulatory Visit
Admission: EM | Admit: 2020-02-29 | Discharge: 2020-02-29 | Disposition: A | Discharge status: Home / Self Care | Payer: BC Managed Care – PPO | Attending: Family Medicine | Admitting: Family Medicine

## 2020-02-29 DIAGNOSIS — N760 Acute vaginitis: Secondary | ICD-10-CM

## 2020-02-29 LAB — POCT URINALYSIS DIP (MANUAL ENTRY)
Bilirubin, UA: NEGATIVE
Glucose, UA: NEGATIVE mg/dL
Ketones, POC UA: NEGATIVE mg/dL
Nitrite, UA: NEGATIVE
Protein Ur, POC: NEGATIVE mg/dL
Spec Grav, UA: 1.02 (ref 1.010–1.025)
Urobilinogen, UA: 0.2 E.U./dL
pH, UA: 6 (ref 5.0–8.0)

## 2020-02-29 LAB — POCT URINE PREGNANCY: Preg Test, Ur: NEGATIVE

## 2020-02-29 MED ORDER — FLUCONAZOLE 150 MG PO TABS
150.0000 mg | ORAL_TABLET | Freq: Every day | ORAL | 0 refills | Status: DC
Start: 2020-02-29 — End: 2020-08-18

## 2020-02-29 MED ORDER — NYSTATIN-TRIAMCINOLONE 100000-0.1 UNIT/GM-% EX CREA
TOPICAL_CREAM | CUTANEOUS | 0 refills | Status: DC
Start: 2020-02-29 — End: 2020-08-18

## 2020-02-29 NOTE — ED Triage Notes (Signed)
Pt reports having vaginal itching and discharge x5 days. Also reports having recurrent yeast infections. Also reports having a rash around vaginal area.

## 2020-02-29 NOTE — Discharge Instructions (Addendum)
Treating you for a yeast infection.  Medicines as prescribed Follow up as needed for continued or worsening symptoms

## 2020-02-29 NOTE — ED Provider Notes (Signed)
[facility]    CSN: [id number] Arrival date & time: 02/29/20  1345      History   Chief Complaint Chief Complaint  Patient presents with   Vaginal Itching    HPI Carmen Hays is a 34 y.o. female.   Patient is a 34 year old female who presents today with approximately 5 days of vaginal itching, irritation and rash.  This is spreading into the rectal area.  She has had some vaginal discharge.  History of recurrent yeast infections.  Using over-the-counter medicine without any relief.  Mild dysuria.  No abdominal pain, back pain or fevers.  No concern for STDs.     Past Medical History:  Diagnosis Date   Anemia    Bronchitis    h/o, seasonal    Patient Active Problem List   Diagnosis Date Noted   Post-operative state 06/19/2019   Fibroid uterus 02/24/2019   Menorrhagia 02/24/2019   Acute blood loss anemia 02/23/2019    Past Surgical History:  Procedure Laterality Date   MYOMECTOMY N/A 06/19/2019   Procedure: ABDOMINAL MYOMECTOMY;  Surgeon: Lavonia Drafts, MD;  Location: [facility];  Service: Gynecology;  Laterality: N/A;    OB History    Gravida  0   Para  0   Term  0   Preterm  0   AB  0   Living  0     SAB  0   IAB  0   Ectopic  0   Multiple  0   Live Births  0            Home Medications    Prior to Admission medications   Medication Sig Start Date End Date Taking? Authorizing Provider  fluconazole (DIFLUCAN) 150 MG tablet Take 1 tablet (150 mg total) by mouth daily. Take one tab now and one tab in 3 days. 02/29/20   Orvan July, NP  nystatin-triamcinolone (MYCOLOG II) cream Apply to affected area daily 02/29/20   Orvan July, NP    Family History Family History  Problem Relation Age of Onset   Cancer Paternal Grandfather        pancreatic   Diabetes Maternal Grandmother    Cancer Maternal Grandfather        lung   Hypertension Mother     Social History Social History   Tobacco Use    Smoking status: Never Smoker   Smokeless tobacco: Never Used  Scientific laboratory technician Use: Never used  Substance Use Topics   Alcohol use: No   Drug use: No     Allergies   Amoxicillin   Review of Systems Review of Systems   Physical Exam Triage Vital Signs ED Triage Vitals  Enc Vitals Group     BP 02/29/20 1403 112/72     Pulse Rate 02/29/20 1403 90     Resp 02/29/20 1403 18     Temp 02/29/20 1403 98 F (36.7 C)     Temp src --      SpO2 02/29/20 1403 98 %     Weight 02/29/20 1404 175 lb (79.4 kg)     Height 02/29/20 1404 5\' 5"  (1.651 m)     Head Circumference --      Peak Flow --      Pain Score 02/29/20 1404 8     Pain Loc --      Pain Edu? --      Excl. in GC? --    No  data found.  Updated Vital Signs BP 112/72    Pulse 90    Temp 98 F (36.7 C)    Resp 18    Ht 5\' 5"  (1.651 m)    Wt 175 lb (79.4 kg)    LMP 02/15/2020    SpO2 98%    BMI 29.12 kg/m   Visual Acuity Right Eye Distance:   Left Eye Distance:   Bilateral Distance:    Right Eye Near:   Left Eye Near:    Bilateral Near:     Physical Exam Vitals and nursing note reviewed.  Constitutional:      General: She is not in acute distress.    Appearance: Normal appearance. She is not ill-appearing, toxic-appearing or diaphoretic.  HENT:     Head: Normocephalic.     Nose: Nose normal.  Eyes:     Conjunctiva/sclera: Conjunctivae normal.  Pulmonary:     Effort: Pulmonary effort is normal.  Musculoskeletal:        General: Normal range of motion.     Cervical back: Normal range of motion.  Skin:    General: Skin is warm and dry.     Findings: No rash.  Neurological:     Mental Status: She is alert.  Psychiatric:        Mood and Affect: Mood normal.      UC Treatments / Results  Labs (all labs ordered are listed, but only abnormal results are displayed) Labs Reviewed  POCT URINALYSIS DIP (MANUAL ENTRY)  POCT URINE PREGNANCY  CERVICOVAGINAL ANCILLARY ONLY    EKG   Radiology No  results found.  Procedures Procedures (including critical care time)  Medications Ordered in UC Medications - No data to display  Initial Impression / Assessment and Plan / UC Course  I have reviewed the triage vital signs and the nursing notes.  Pertinent labs & imaging results that were available during my care of the patient were reviewed by me and considered in my medical decision making (see chart for details).     Vaginitis Treating for yeast infection based on symptoms and history. Swab sent for testing. Urine sent for culture Follow up as needed for continued or worsening symptoms  Final Clinical Impressions(s) / UC Diagnoses   Final diagnoses:  Vaginitis and vulvovaginitis     Discharge Instructions     Treating you for a yeast infection.  Medicines as prescribed Follow up as needed for continued or worsening symptoms     ED Prescriptions    Medication Sig Dispense Auth. Provider   nystatin-triamcinolone (MYCOLOG II) cream Apply to affected area daily 15 g Joel Cowin A, NP   fluconazole (DIFLUCAN) 150 MG tablet Take 1 tablet (150 mg total) by mouth daily. Take one tab now and one tab in 3 days. 2 tablet Loura Halt A, NP     PDMP not reviewed this encounter.   Orvan July, NP 02/29/20 1431

## 2020-03-02 LAB — URINE CULTURE

## 2020-03-03 ENCOUNTER — Other Ambulatory Visit: Payer: Medicaid Other

## 2020-03-03 LAB — CERVICOVAGINAL ANCILLARY ONLY
Bacterial Vaginitis (gardnerella): NEGATIVE
Candida Glabrata: NEGATIVE
Candida Vaginitis: NEGATIVE
Chlamydia: NEGATIVE
Comment: NEGATIVE
Comment: NEGATIVE
Comment: NEGATIVE
Comment: NEGATIVE
Comment: NEGATIVE
Comment: NORMAL
Neisseria Gonorrhea: NEGATIVE
Trichomonas: NEGATIVE

## 2020-08-18 ENCOUNTER — Other Ambulatory Visit: Payer: Self-pay

## 2020-08-18 ENCOUNTER — Other Ambulatory Visit (HOSPITAL_COMMUNITY)
Admission: RE | Admit: 2020-08-18 | Discharge: 2020-08-18 | Disposition: A | Discharge status: Home / Self Care | Payer: Commercial Managed Care - PPO | Source: Ambulatory Visit | Attending: Obstetrics & Gynecology | Admitting: Obstetrics & Gynecology

## 2020-08-18 ENCOUNTER — Ambulatory Visit (INDEPENDENT_AMBULATORY_CARE_PROVIDER_SITE_OTHER): Payer: Commercial Managed Care - PPO | Admitting: Obstetrics & Gynecology

## 2020-08-18 ENCOUNTER — Encounter: Payer: Self-pay | Admitting: Obstetrics & Gynecology

## 2020-08-18 VITALS — BP 105/62 | HR 66 | Temp 98.4°F | Ht 64.0 in | Wt 172.0 lb

## 2020-08-18 DIAGNOSIS — N898 Other specified noninflammatory disorders of vagina: Secondary | ICD-10-CM | POA: Insufficient documentation

## 2020-08-18 DIAGNOSIS — Z01419 Encounter for gynecological examination (general) (routine) without abnormal findings: Secondary | ICD-10-CM | POA: Insufficient documentation

## 2020-08-18 DIAGNOSIS — Z113 Encounter for screening for infections with a predominantly sexual mode of transmission: Secondary | ICD-10-CM

## 2020-08-18 DIAGNOSIS — B373 Candidiasis of vulva and vagina: Secondary | ICD-10-CM | POA: Diagnosis not present

## 2020-08-18 DIAGNOSIS — B3731 Acute candidiasis of vulva and vagina: Secondary | ICD-10-CM

## 2020-08-18 DIAGNOSIS — Z3169 Encounter for other general counseling and advice on procreation: Secondary | ICD-10-CM

## 2020-08-18 MED ORDER — TERCONAZOLE 0.4 % VA CREA: 1.0000 | TOPICAL_CREAM | Freq: Every day | VAGINAL | 0 refills | Status: DC

## 2020-08-18 NOTE — Progress Notes (Signed)
   Patient in clinic for annual. PAP completed 04/23/2019, WNL. Patient has complaints vaginal discharge and odor. Patient stated it is a "reoccurring issue."  Derl Barrow, RN

## 2020-08-18 NOTE — Patient Instructions (Signed)
Vaginitis  Vaginitis is a condition in which the vaginal tissue swells and becomes irritated. This condition is most often caused by a change in the normal balance of bacteria and yeast that live in the vagina. This change causes an overgrowth of certain bacteria or yeast, which causes the inflammation. There are different types of vaginitis. What are the causes? The cause of this condition depends on the type of vaginitis. It can be caused by:  Bacteria (bacterial vaginosis).  Yeast, which is a fungus (candidiasis).  A parasite (trichomoniasis vaginitis).  A virus (viral vaginitis).  Low hormone levels (atrophic vaginitis). Low hormone levels can occur during pregnancy, breastfeeding, or after menopause.  Irritants, such as bubble baths, scented tampons, and feminine sprays (allergic vaginitis). Other factors can change the normal balance of the yeast and bacteria that live in the vagina. These include:  Antibiotic medicines.  Poor hygiene.  Diaphragms, vaginal sponges, spermicides, birth control pills, and intrauterine devices (IUDs).  Sex.  Infection.  Uncontrolled diabetes.  A weakened body defense system (immune system). What increases the risk? This condition is more likely to develop in women who:  Smoke or are exposed to secondhand smoke.  Use vaginal douches, scented tampons, or scented sanitary pads.  Wear tight-fitting pants or thong underwear.  Use oral birth control pills or an IUD.  Have sex without a condom or have multiple partners.  Have an STI.  Frequently use the spermicide nonoxynol-9.  Eat lots of foods high in sugar or who have uncontrolled diabetes.  Have low estrogen levels.  Have a weakened immune system from an immune disorder or medical treatment.  Are pregnant or breastfeeding. What are the signs or symptoms? Symptoms vary depending on the cause of the vaginitis. Common symptoms include:  Abnormal vaginal discharge. ? The  discharge is white, gray, or yellow with bacterial vaginosis. ? The discharge is thick, white, and cheesy with a yeast infection. ? The discharge is frothy and yellow or greenish with trichomoniasis.  A bad vaginal smell. The smell is fishy with bacterial vaginosis.  Vaginal itching, pain, or swelling.  Pain with sex.  Pain or burning when urinating. Sometimes there are no symptoms. How is this diagnosed? This condition is diagnosed based on your symptoms and medical history. A physical exam, including a pelvic exam, will also be done. You may also have other tests, including:  Tests to determine the pH level (acidity or alkalinity) of your vagina.  A whiff test to assess the odor that results when a sample of your vaginal discharge is mixed with a potassium hydroxide solution.  Tests of vaginal fluid. A sample will be examined under a microscope. How is this treated? Treatment varies depending on the type of vaginitis you have. Your treatment may include:  Antibiotic creams or pills to treat bacterial vaginosis and trichomoniasis.  Antifungal medicines, such as vaginal creams or suppositories, to treat a yeast infection.  Medicine to ease discomfort if you have viral vaginitis. Your sexual partner should also be treated.  Estrogen delivered in a cream, pill, suppository, or vaginal ring to treat atrophic vaginitis. If vaginal dryness occurs, lubricants and moisturizing creams may help. You may need to avoid scented soaps, sprays, or douches.  Stopping use of a product that is causing allergic vaginitis and then using a vaginal cream to treat the symptoms. Follow these instructions at home: Lifestyle  Keep your genital area clean and dry. Avoid soap, and only rinse the area with water.  Do not douche  or use tampons until your health care provider says it is okay. Use sanitary pads, if needed.  Do not have sex until your health care provider approves. When you can return to sex,  practice safe sex and use condoms.  Wipe from front to back. This avoids the spread of bacteria from the rectum to the vagina. General instructions  Take over-the-counter and prescription medicines only as told by your health care provider.  If you were prescribed an antibiotic medicine, take or use it as told by your health care provider. Do not stop taking or using the antibiotic even if you start to feel better.  Keep all follow-up visits. This is important. How is this prevented?  Use mild, unscented products. Do not use things that can irritate the vagina, such as fabric softeners. Avoid the following products if they are scented: ? Feminine sprays. ? Detergents. ? Tampons. ? Feminine hygiene products. ? Soaps or bubble baths.  Let air reach your genital area. To do this: ? Wear cotton underwear to reduce moisture buildup. ? Avoid wearing underwear while you sleep. ? Avoid wearing tight pants and underwear or nylons without a cotton panel. ? Avoid wearing thong underwear.  Take off any wet clothing, such as bathing suits, as soon as possible.  Practice safe sex and use condoms. Contact a health care provider if:  You have abdominal or pelvic pain.  You have a fever or chills.  You have symptoms that last for more than 2-3 days. Get help right away if:  You have a fever and your symptoms suddenly get worse. Summary  Vaginitis is a condition in which the vaginal tissue becomes inflamed.This condition is most often caused by a change in the normal balance of bacteria and yeast that live in the vagina.  Treatment varies depending on the type of vaginitis you have.  Do not douche, use tampons, or have sex until your health care provider approves. When you can return to sex, practice safe sex and use condoms. This information is not intended to replace advice given to you by your health care provider. Make sure you discuss any questions you have with your health care  provider. Document Revised: 08/30/2019 Document Reviewed: 08/30/2019 Elsevier Patient Education  [place].

## 2020-08-18 NOTE — Progress Notes (Signed)
Subjective:     Carmen Hays is a 35 y.o. female here for a routine exam.  Current complaints: Pt is s/p myomecotmy in April 2021. She reports normal cycles that last 5 days and are light. She does want an STI screen as she has itching. She reports recurrent sx that may be yeast or BV. She is not sure. Currently she reports itching. She wears cotton underwear. She recently got married and wishes to conceive.   Gynecologic History Patient's last menstrual period was 08/12/2020 (exact date). Contraception: none Last Pap: 28/2021. Results were: normal Last mammogram: n/a.  Obstetric History OB History  Gravida Para Term Preterm AB Living  0 0 0 0 0 0  SAB IAB Ectopic Multiple Live Births  0 0 0 0 0   The following portions of the patient's history were reviewed and updated as appropriate: allergies, current medications, past family history, past medical history, past social history, past surgical history and problem list.  Review of Systems Pertinent items are noted in HPI.    Objective:  BP 105/62 (BP Location: Left Arm, Patient Position: Sitting, Cuff Size: Normal)   Pulse 66   Temp 98.4 F (36.9 C) (Oral)   Ht 5\' 4"  (1.626 m)   Wt 172 lb (78 kg)   LMP 08/12/2020 (Exact Date)   BMI 29.52 kg/m  General Appearance:    Alert, cooperative, no distress, appears stated age  Head:    Normocephalic, without obvious abnormality, atraumatic  Eyes:    conjunctiva/corneas clear, EOM's intact, both eyes  Ears:    Normal external ear canals, both ears  Nose:   Nares normal, septum midline, mucosa normal, no drainage    or sinus tenderness  Throat:   Lips, mucosa, and tongue normal; teeth and gums normal  Neck:   Supple, symmetrical, trachea midline, no adenopathy;    thyroid:  no enlargement/tenderness/nodules  Back:     Symmetric, no curvature, ROM normal, no CVA tenderness  Lungs:     respirations unlabored  Chest Wall:    No tenderness or deformity   Heart:    Regular rate and rhythm   Breast Exam:    No tenderness, masses, or nipple abnormality  Abdomen:     Soft, non-tender, bowel sounds active all four quadrants,    no masses, no organomegaly. Well healed transverse incision with small keloid.     Genitalia:    Normal female without lesion, discharge or tenderness   Uterus- small and mobile.    Extremities:   Extremities normal, atraumatic, no cyanosis or edema  Pulses:   2+ and symmetric all extremities  Skin:   Skin color, texture, turgor normal, no rashes or lesions     Assessment:    Healthy female exam.   S/p myomectomy >1 yea. Pt desires pregnancy. Rec PNV's    Plan:  Margarine was seen today for gynecologic exam.  Diagnoses and all orders for this visit:  Encounter for annual routine gynecological examination -     Cervicovaginal ancillary only( [place]) -     Hemoglobin A1c  Vaginal discharge -     Cervicovaginal ancillary only( [place])  Screen for STD (sexually transmitted disease) -     Cervicovaginal ancillary only( [place])  Vulvovaginitis due to yeast -     Hemoglobin A1c -     terconazole (TERAZOL 7) 0.4 % vaginal cream; Place 1 applicator vaginally at bedtime. Use for seven days  Encounter for preconception consultation  f/u  in 1 year or sooner prn   Donya Tomaro L. Harraway-Smith, M.D., Cherlynn June

## 2020-08-19 LAB — CERVICOVAGINAL ANCILLARY ONLY
Bacterial Vaginitis (gardnerella): NEGATIVE
Candida Glabrata: NEGATIVE
Candida Vaginitis: POSITIVE — AB
Chlamydia: NEGATIVE
Comment: NEGATIVE
Comment: NEGATIVE
Comment: NEGATIVE
Comment: NEGATIVE
Comment: NEGATIVE
Comment: NORMAL
Neisseria Gonorrhea: NEGATIVE
Trichomonas: NEGATIVE

## 2020-08-19 LAB — HEMOGLOBIN A1C
Est. average glucose Bld gHb Est-mCnc: 103 mg/dL
Hgb A1c MFr Bld: 5.2 % (ref 4.8–5.6)

## 2020-09-22 ENCOUNTER — Telehealth: Payer: Self-pay

## 2020-09-22 NOTE — Telephone Encounter (Signed)
-----   Message from Maurine Minister, [place] sent at 09/18/2020  2:13 PM EDT ----- Regarding: PCOS Has questions about PCOS for the nurse and would like a call back.

## 2020-09-22 NOTE — Telephone Encounter (Signed)
Pt called wanting to know if she can have further testing to see if she has PCOS. A message will be sent to the provider. Mickie Kozikowski l Jovoni Borkenhagen, CMA

## 2020-10-01 ENCOUNTER — Other Ambulatory Visit: Payer: Self-pay | Admitting: Obstetrics & Gynecology

## 2020-10-08 ENCOUNTER — Other Ambulatory Visit: Payer: Self-pay

## 2020-10-08 DIAGNOSIS — R109 Unspecified abdominal pain: Secondary | ICD-10-CM

## 2020-10-17 ENCOUNTER — Encounter (HOSPITAL_BASED_OUTPATIENT_CLINIC_OR_DEPARTMENT_OTHER): Payer: Self-pay

## 2020-10-17 ENCOUNTER — Other Ambulatory Visit: Payer: Self-pay | Admitting: Obstetrics & Gynecology

## 2020-10-17 ENCOUNTER — Other Ambulatory Visit: Payer: Self-pay

## 2020-10-17 ENCOUNTER — Ambulatory Visit (HOSPITAL_BASED_OUTPATIENT_CLINIC_OR_DEPARTMENT_OTHER): Admission: RE | Admit: 2020-10-17 | Payer: Commercial Managed Care - PPO | Source: Ambulatory Visit

## 2020-10-17 ENCOUNTER — Ambulatory Visit (HOSPITAL_BASED_OUTPATIENT_CLINIC_OR_DEPARTMENT_OTHER)
Admission: RE | Admit: 2020-10-17 | Discharge: 2020-10-17 | Disposition: A | Discharge status: Home / Self Care | Payer: Commercial Managed Care - PPO | Source: Ambulatory Visit | Attending: Obstetrics & Gynecology | Admitting: Obstetrics & Gynecology

## 2020-10-17 DIAGNOSIS — D219 Benign neoplasm of connective and other soft tissue, unspecified: Secondary | ICD-10-CM

## 2020-10-17 DIAGNOSIS — R109 Unspecified abdominal pain: Secondary | ICD-10-CM

## 2020-10-22 ENCOUNTER — Other Ambulatory Visit: Payer: Self-pay

## 2020-10-22 ENCOUNTER — Encounter: Payer: Self-pay | Admitting: Obstetrics & Gynecology

## 2020-10-22 ENCOUNTER — Telehealth (INDEPENDENT_AMBULATORY_CARE_PROVIDER_SITE_OTHER): Payer: Commercial Managed Care - PPO | Admitting: Obstetrics & Gynecology

## 2020-10-22 DIAGNOSIS — D259 Leiomyoma of uterus, unspecified: Secondary | ICD-10-CM

## 2020-10-22 DIAGNOSIS — D219 Benign neoplasm of connective and other soft tissue, unspecified: Secondary | ICD-10-CM

## 2020-10-22 DIAGNOSIS — R102 Pelvic and perineal pain: Secondary | ICD-10-CM

## 2020-10-22 NOTE — Progress Notes (Signed)
GYNECOLOGY VIRTUAL VISIT ENCOUNTER NOTE  Provider location: Center for [place] at [facility]   Patient location: Home  I connected with Carmen Hays on 10/22/20 at  2:10 PM EDT by MyChart Video Encounter and verified that I am speaking with the correct person using two identifiers.   I discussed the limitations, risks, security and privacy concerns of performing an evaluation and management service virtually and the availability of in person appointments. I also discussed with the patient that there may be a patient responsible charge related to this service. The patient expressed understanding and agreed to proceed.   History:  Carmen Hays is a 35 y.o. G0P0000 female being evaluated today for abd cramping and she was worried about the return of fibroids. Pt has not been having menses. Her LMP was 10/08/2020. She reports monthly cycles that are light. No blood clots. Menses last 5-7 days. She denies any abnormal vaginal discharge, bleeding, pelvic pain or other concerns.  Pt reports daily BM. She does not believe its related to the stool.  Pt has not taken an meds for this.     Past Medical History:  Diagnosis Date   Anemia    Bronchitis    h/o, seasonal   Past Surgical History:  Procedure Laterality Date   MYOMECTOMY N/A 06/19/2019   Procedure: ABDOMINAL MYOMECTOMY;  Surgeon: Lavonia Drafts, MD;  Location: [place];  Service: Gynecology;  Laterality: N/A;   The following portions of the patient's history were reviewed and updated as appropriate: allergies, current medications, past family history, past medical history, past social history, past surgical history and problem list.   Review of Systems:  Pertinent items noted in HPI and remainder of comprehensive ROS otherwise negative.  Physical Exam:   General:  Alert, oriented and cooperative. Patient appears to be in no acute distress.  Mental Status: Normal mood and affect. Normal behavior. Normal  judgment and thought content.   Respiratory: Normal respiratory effort, no problems with respiration noted  Rest of physical exam deferred due to type of encounter  Labs and Imaging No results found for this or any previous visit (from the past 336 hour(s)). US PELVIC COMPLETE WITH TRANSVAGINAL  Result Date: 10/18/2020 CLINICAL DATA:  Abdominal pain, history of fibroids, myomectomy 06/19/2019 EXAM: TRANSABDOMINAL AND TRANSVAGINAL ULTRASOUND OF PELVIS TECHNIQUE: Both transabdominal and transvaginal ultrasound examinations of the pelvis were performed. Transabdominal technique was performed for global imaging of the pelvis including uterus, ovaries, adnexal regions, and pelvic cul-de-sac. It was necessary to proceed with endovaginal exam following the transabdominal exam to visualize the uterus, endometrium, and ovaries. COMPARISON:  Ultrasound 02/23/2019 FINDINGS: Uterus Measurements: 11.4 x 4.3 x 5.3 cm = volume: 136 mL. Postsurgical changes from prior myomectomy with removal of several bulky large uterine fibroids. Heterogeneous focus in the right anterolateral uterine fundus measures 0.9 x 1.2 x 1.3 cm possibly reflecting some residual leiomyoma. Endometrium Thickness: 4.8 mm, non thickened.  No focal abnormality visualized. Right ovary Measurements: 3.8 x 2.2 x 2.2 cm = volume: 9.3 mL. Poorly visualized transvaginally, unremarkable transabdominal appearance. Left ovary Measurements: 2.9 x 1.6 x 2.8 cm = volume: 7.1 mL. Normal appearance. No concerning adnexal mass. Other findings No abnormal free fluid. IMPRESSION: Patient is post myomectomy with debulking of several large uterine leiomyomata seen on comparison sonography. Heterogeneous 1.3 cm focus towards the right lateral uterine fundus may reflect some residual leiomyoma. Overall fibroid burden significantly diminished from comparison prior. Electronically Signed   By: Lovena Le  M.D.   On: 10/18/2020 01:01       Assessment and Plan:     Pelvic  cramping.  Etiology unknown.   Motrin or NSAIDS prn  Preconception counseling  Begin PNV       I discussed the assessment and treatment plan with the patient. The patient was provided an opportunity to ask questions and all were answered. The patient agreed with the plan and demonstrated an understanding of the instructions.   The patient was advised to call back or seek an in-person evaluation/go to the ED if the symptoms worsen or if the condition fails to improve as anticipated.  I provided 20 minutes of face-to-face time during this encounter.   Lavonia Drafts, MD Center for [employer], [place]

## 2021-03-26 NOTE — Progress Notes (Signed)
GYNECOLOGY VIRTUAL VISIT ENCOUNTER NOTE  Provider location: Center for [place] at [facility]   Patient location: Home  I connected with Carmen Hays on 03/26/21 at  9:35 AM EST by MyChart Video Encounter and verified that I am speaking with the correct person using two identifiers.   I discussed the limitations, risks, security and privacy concerns of performing an evaluation and management service virtually and the availability of in person appointments. I also discussed with the patient that there may be a patient responsible charge related to this service. The patient expressed understanding and agreed to proceed.   History:  Carmen Hays is a 36 y.o. G0P0000 female being evaluated today for a request for psychiatric evaluation. She does not have a PCP and her EAP said I could request it.   She would like to request it because she is having issues with mood changes, depression, and anxiety. It has been going on for some time. She notes it is affecting her marriage as she and her husband fight more often and it is also affecting their ability to start TTC. Her husband wants to wait until her mood has stabilized before TTC and the patient does agree with this. She was diagnosed with PMDD in the past and did Yasmin which helped but she would not want to do this again because she would want to TTC.   She also has some stress around the marble sized fibroid that is still present. She knows that doesn't affect anything now but she worries about it growing.   She notes mood changes the 1-2 weeks prior to her period (so half her cycle).    She denies any abnormal vaginal discharge, bleeding, pelvic pain or other concerns.       Past Medical History:  Diagnosis Date   Anemia    Bronchitis    h/o, seasonal   Past Surgical History:  Procedure Laterality Date   MYOMECTOMY N/A 06/19/2019   Procedure: ABDOMINAL MYOMECTOMY;  Surgeon: Lavonia Drafts, MD;  Location:  [place];  Service: Gynecology;  Laterality: N/A;   The following portions of the patient's history were reviewed and updated as appropriate: allergies, current medications, past family history, past medical history, past social history, past surgical history and problem list.   Health Maintenance:  Normal pap and negative HRHPV on 04/2019.    Review of Systems:  Pertinent items noted in HPI and remainder of comprehensive ROS otherwise negative.  Physical Exam:   General:  Alert, oriented and cooperative. Patient appears to be in no acute distress.  Mental Status: Normal mood and affect. Normal behavior. Normal judgment and thought content.   Respiratory: Normal respiratory effort, no problems with respiration noted  Rest of physical exam deferred due to type of encounter  Labs and Imaging None   Assessment and Plan:     Diagnoses and all orders for this visit:  Mood swings  - Letter sent for patient to have psychiatric evaluation - Reviewed I think it would be beneficial before stating PMDD (especially given symptoms are sometimes 2 weeks prior to her period).  - She will follow up with [place] in 6 months for annual or sooner if she is havign any other additional problems.      I discussed the assessment and treatment plan with the patient. The patient was provided an opportunity to ask questions and all were answered. The patient agreed with the plan and demonstrated an understanding of the instructions.  The patient was advised to call back or seek an in-person evaluation/go to the ED if the symptoms worsen or if the condition fails to improve as anticipated.  I provided 9 minutes of face-to-face time during this encounter.   Radene Gunning, MD Center for [place], [place]

## 2021-03-27 ENCOUNTER — Telehealth (INDEPENDENT_AMBULATORY_CARE_PROVIDER_SITE_OTHER): Payer: Commercial Managed Care - PPO | Admitting: Obstetrics and Gynecology

## 2021-03-27 ENCOUNTER — Encounter: Payer: Self-pay | Admitting: Obstetrics and Gynecology

## 2021-03-27 DIAGNOSIS — R4586 Emotional lability: Secondary | ICD-10-CM | POA: Diagnosis not present

## 2021-03-27 DIAGNOSIS — Z3169 Encounter for other general counseling and advice on procreation: Secondary | ICD-10-CM

## 2021-04-14 ENCOUNTER — Telehealth: Payer: Self-pay

## 2021-04-14 NOTE — Telephone Encounter (Signed)
Pt called stating she was given a letter of recommendation for a psych eval and can't be seen until mid summer. Pt states she was told that it is ok for her OBGYN to write a Rx for psych meds since her symptoms are due to PMDD. A message will be sent to the provider.  Understanding was voiced. Liliana Dang l Terek Bee, CMA

## 2021-04-14 NOTE — Telephone Encounter (Signed)
Collene Mares counselor with [employer] [phone], called stating pt is having some mood swings that are most likely hormonal. Juliann Pulse states that her mood swings could lead to anxiety and depression issues. A message will be sent to the provider.  Lolita Faulds l Falynn Ailey, CMA

## 2021-04-15 ENCOUNTER — Other Ambulatory Visit: Payer: Self-pay | Admitting: Obstetrics and Gynecology

## 2021-04-15 DIAGNOSIS — R4586 Emotional lability: Secondary | ICD-10-CM

## 2021-04-15 MED ORDER — FLUOXETINE HCL 10 MG PO CAPS: 10.0000 mg | ORAL_CAPSULE | Freq: Every day | ORAL | 3 refills | Status: AC

## 2021-04-17 ENCOUNTER — Telehealth: Payer: Self-pay

## 2021-04-17 NOTE — Telephone Encounter (Signed)
-----   Message from Radene Gunning, MD sent at 04/15/2021  5:28 PM EST ----- Regarding: RE: Psych Evaluation I sent prozac to her pharmacy for the 1-2 weeks prior ot her period based on her description of sympotms. I still would want her to make appt with psychiatrist in the interim.   Thanks, pad  ----- Message ----- From: Valentina Lucks, CMA Sent: 04/14/2021   2:02 PM EST To: Radene Gunning, MD Subject: Psych Evaluation                               This pt called stating she can not get in to see psych until mid summer. She states she was told that it is ok for her Gyn to write a Rx for her since she is having symptoms of PMDD. She wants to know will you send a Rx in for her or will she need to come see you again?

## 2021-04-17 NOTE — Telephone Encounter (Signed)
Called pt and left message for her to call the office back. Pippa Hanif l Navid Lenzen, CMA

## 2021-06-18 ENCOUNTER — Encounter: Payer: Self-pay | Admitting: General Practice

## 2021-09-21 ENCOUNTER — Ambulatory Visit (INDEPENDENT_AMBULATORY_CARE_PROVIDER_SITE_OTHER): Payer: Commercial Managed Care - PPO

## 2021-09-21 ENCOUNTER — Other Ambulatory Visit (HOSPITAL_BASED_OUTPATIENT_CLINIC_OR_DEPARTMENT_OTHER): Payer: Self-pay

## 2021-09-21 ENCOUNTER — Other Ambulatory Visit (HOSPITAL_COMMUNITY)
Admission: RE | Admit: 2021-09-21 | Discharge: 2021-09-21 | Disposition: A | Discharge status: Home / Self Care | Payer: Commercial Managed Care - PPO | Source: Ambulatory Visit | Attending: Family Medicine | Admitting: Family Medicine

## 2021-09-21 VITALS — BP 101/68 | HR 77 | Wt 191.0 lb

## 2021-09-21 DIAGNOSIS — R399 Unspecified symptoms and signs involving the genitourinary system: Secondary | ICD-10-CM | POA: Diagnosis not present

## 2021-09-21 DIAGNOSIS — Z113 Encounter for screening for infections with a predominantly sexual mode of transmission: Secondary | ICD-10-CM | POA: Diagnosis present

## 2021-09-21 LAB — POCT URINALYSIS DIPSTICK
Bilirubin, UA: NEGATIVE
Blood, UA: POSITIVE
Glucose, UA: NEGATIVE
Ketones, UA: NEGATIVE
Nitrite, UA: NEGATIVE
Protein, UA: NEGATIVE
Spec Grav, UA: 1.015 (ref 1.010–1.025)
Urobilinogen, UA: 0.2 E.U./dL
pH, UA: 5 (ref 5.0–8.0)

## 2021-09-21 MED ORDER — PHENAZOPYRIDINE HCL 200 MG PO TABS
200.0000 mg | ORAL_TABLET | Freq: Three times a day (TID) | ORAL | 0 refills | Status: AC | PRN
Start: 2021-09-21 — End: ?
  Filled 2021-09-21: qty 6, 2d supply, fill #0

## 2021-09-21 MED ORDER — NITROFURANTOIN MONOHYD MACRO 100 MG PO CAPS
100.0000 mg | ORAL_CAPSULE | Freq: Two times a day (BID) | ORAL | 0 refills | Status: AC
  Filled 2021-09-21: qty 10, 5d supply, fill #0

## 2021-09-21 NOTE — Progress Notes (Signed)
SUBJECTIVE: Carmen Hays is a 36 y.o. female who complains of urinary frequency, urgency, dysuria, and back pain x 7 days, without fever, chills, or abnormal vaginal discharge or bleeding. Pt states she has been taking AZO but it has not provided any relief.Pt also requests STI screening.   OBJECTIVE: Appears well, in no apparent distress.  Vital signs are normal. Urine dipstick shows positive for RBC's and leukocytes.   ASSESSMENT: Dysuria  PLAN: Treatment per orders. Self swab was sent to the lab. Call or return to clinic prn if these symptoms worsen or fail to improve as anticipated.

## 2021-09-22 LAB — CERVICOVAGINAL ANCILLARY ONLY
Bacterial Vaginitis (gardnerella): NEGATIVE
Candida Glabrata: NEGATIVE
Candida Vaginitis: NEGATIVE
Chlamydia: NEGATIVE
Comment: NEGATIVE
Comment: NEGATIVE
Comment: NEGATIVE
Comment: NEGATIVE
Comment: NEGATIVE
Comment: NORMAL
Neisseria Gonorrhea: NEGATIVE
Trichomonas: NEGATIVE

## 2021-09-24 ENCOUNTER — Other Ambulatory Visit (HOSPITAL_BASED_OUTPATIENT_CLINIC_OR_DEPARTMENT_OTHER): Payer: Self-pay

## 2021-09-24 LAB — URINE CULTURE

## 2021-09-24 MED ORDER — SULFAMETHOXAZOLE-TRIMETHOPRIM 800-160 MG PO TABS
1.0000 | ORAL_TABLET | Freq: Two times a day (BID) | ORAL | 0 refills | Status: AC
  Filled 2021-09-24: qty 6, 3d supply, fill #0

## 2021-09-24 NOTE — Addendum Note (Signed)
Addended by: Donnamae Jude on: 09/24/2021 03:54 PM   Modules accepted: Orders

## 2021-10-05 ENCOUNTER — Other Ambulatory Visit (HOSPITAL_BASED_OUTPATIENT_CLINIC_OR_DEPARTMENT_OTHER): Payer: Self-pay

## 2021-11-20 ENCOUNTER — Telehealth: Payer: Self-pay | Admitting: General Practice

## 2021-11-20 NOTE — Telephone Encounter (Signed)
Attempted to call pt to reschedule appt that is on 12/02/2021 d/t provider will be on medical leave.  The number is not a working number.  Will send a Mychart message.

## 2021-12-02 ENCOUNTER — Ambulatory Visit: Payer: Commercial Managed Care - PPO | Admitting: Obstetrics & Gynecology

## 2022-01-27 ENCOUNTER — Ambulatory Visit: Payer: Commercial Managed Care - PPO | Admitting: Obstetrics & Gynecology

## 2022-03-31 ENCOUNTER — Ambulatory Visit: Payer: Medicaid Other | Admitting: Obstetrics & Gynecology

## 2022-03-31 ENCOUNTER — Encounter: Payer: Self-pay | Admitting: General Practice

## 2022-03-31 ENCOUNTER — Telehealth: Payer: Self-pay | Admitting: General Practice

## 2022-03-31 ENCOUNTER — Other Ambulatory Visit (HOSPITAL_COMMUNITY)
Admission: RE | Admit: 2022-03-31 | Discharge: 2022-03-31 | Disposition: A | Discharge status: Home / Self Care | Payer: Medicaid Other | Source: Ambulatory Visit | Attending: Obstetrics & Gynecology | Admitting: Obstetrics & Gynecology

## 2022-03-31 ENCOUNTER — Encounter: Payer: Self-pay | Admitting: Obstetrics & Gynecology

## 2022-03-31 VITALS — BP 117/72 | HR 91 | Ht 65.0 in | Wt 201.0 lb

## 2022-03-31 DIAGNOSIS — Z01419 Encounter for gynecological examination (general) (routine) without abnormal findings: Secondary | ICD-10-CM | POA: Diagnosis present

## 2022-03-31 DIAGNOSIS — D219 Benign neoplasm of connective and other soft tissue, unspecified: Secondary | ICD-10-CM

## 2022-03-31 NOTE — Progress Notes (Signed)
Subjective:     Carmen Hays is a 37 y.o. female here for a routine exam.  Current complaints: none. Pt is worried about size of fibroids and is worried about whether they are growing. She is having no sx. No pain or abnormal bleeding.      Gynecologic History Patient's last menstrual period was 03/01/2022 (exact date). Contraception: none Last Pap: 04/23/2019. Results were: normal Last mammogram: n/a.   Obstetric History OB History  Gravida Para Term Preterm AB Living  0 0 0 0 0 0  SAB IAB Ectopic Multiple Live Births  0 0 0 0 0    The following portions of the patient's history were reviewed and updated as appropriate: allergies, current medications, past family history, past medical history, past social history, past surgical history, and problem list.  Review of Systems Pertinent items are noted in HPI.    Objective:  BP 117/72   Pulse 91   Ht '5\' 5"'$  (1.651 m)   Wt 201 lb (91.2 kg)   LMP 03/01/2022 (Exact Date)   BMI 33.45 kg/m   General Appearance:    Alert, cooperative, no distress, appears stated age  Head:    Normocephalic, without obvious abnormality, atraumatic  Eyes:    conjunctiva/corneas clear, EOM's intact, both eyes  Ears:    Normal external ear canals, both ears  Nose:   Nares normal, septum midline, mucosa normal, no drainage    or sinus tenderness  Throat:   Lips, mucosa, and tongue normal; teeth and gums normal  Neck:   Supple, symmetrical, trachea midline, no adenopathy;    thyroid:  no enlargement/tenderness/nodules  Back:     Symmetric, no curvature, ROM normal, no CVA tenderness  Lungs:     respirations unlabored  Chest Wall:    No tenderness or deformity   Heart:    Regular rate and rhythm  Breast Exam:    No tenderness, masses, or nipple abnormality  Abdomen:     Soft, non-tender, bowel sounds active all four quadrants,    no masses, no organomegaly  Genitalia:    Normal female without lesion, discharge or tenderness   Uterus: small and mobile.  Nontender    Extremities:   Extremities normal, atraumatic, no cyanosis or edema  Pulses:   2+ and symmetric all extremities  Skin:   Skin color, texture, turgor normal, no rashes or lesions     Assessment:    Healthy female exam.    Plan:   Diagnoses and all orders for this visit:  Well female exam with routine gynecological exam -     Cytology - PAP( [place])  Fibroids   Reviewed with pt when to reeval her fibroids. At present, this is not indicated as they are not symptomatic. F/u in 1 year or sooner prn    [name] L. Harraway-Smith, M.D., Cherlynn June

## 2022-03-31 NOTE — Telephone Encounter (Signed)
Charity application given to patient. 

## 2022-04-02 LAB — CYTOLOGY - PAP
Comment: NEGATIVE
Diagnosis: NEGATIVE
High risk HPV: NEGATIVE

## 2022-05-17 IMAGING — US US PELVIS COMPLETE WITH TRANSVAGINAL
1 series · 13 of 25 positions shown · non-contrast
Comparison: Ultrasound 02/23/2019

CLINICAL DATA: Abdominal pain, history of fibroids, myomectomy
06/19/2019



[Series 1: us pelvis complete with transvaginal · 13 of 79 slices shown]
[im 1/79]
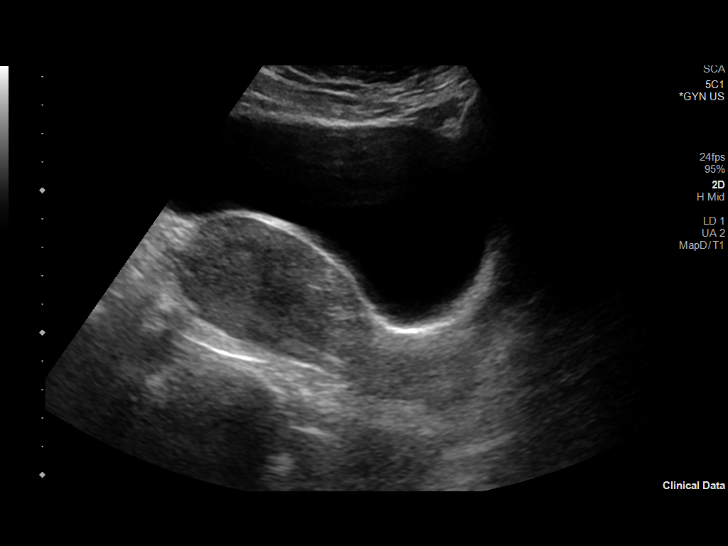
[im 7/79]
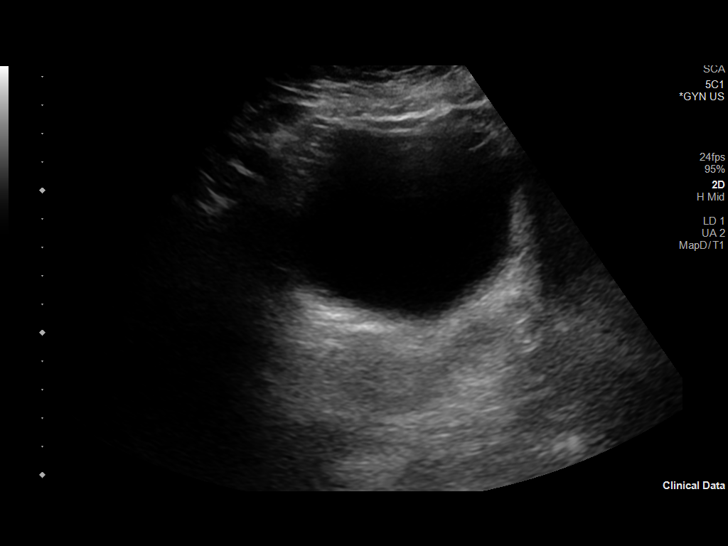
[im 14/79]
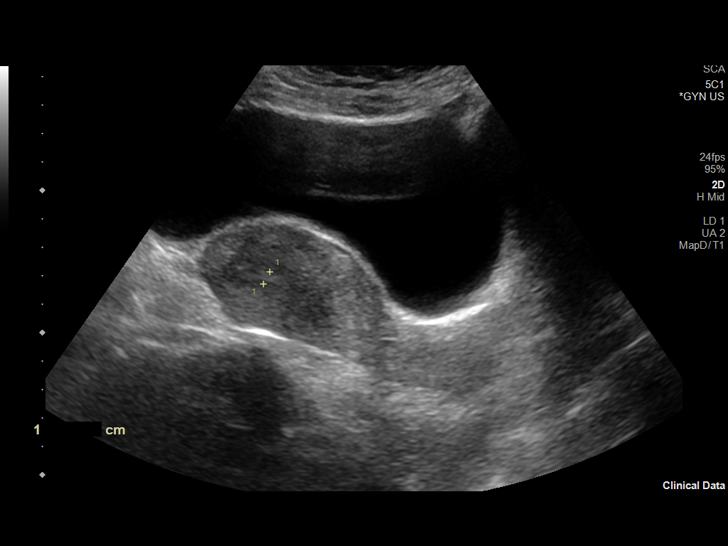
[im 20/79]
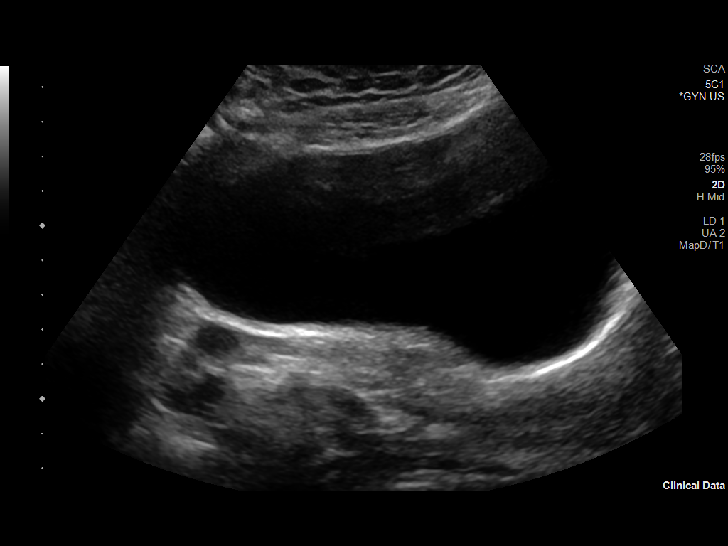
[im 27/79]
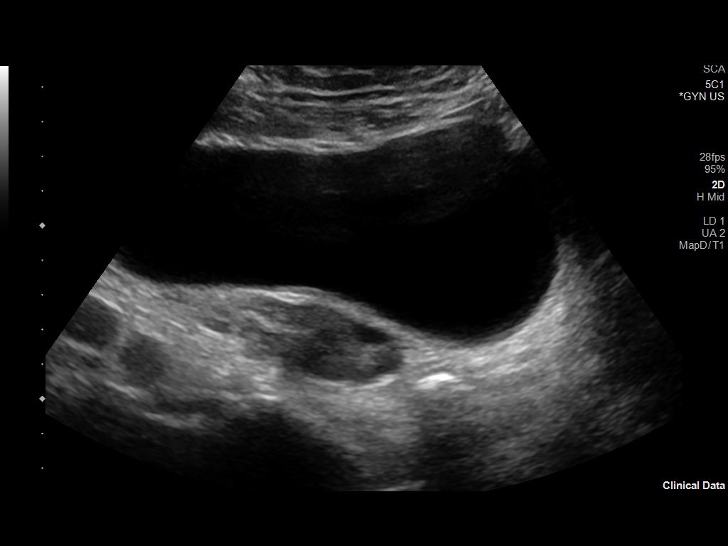
[im 33/79]
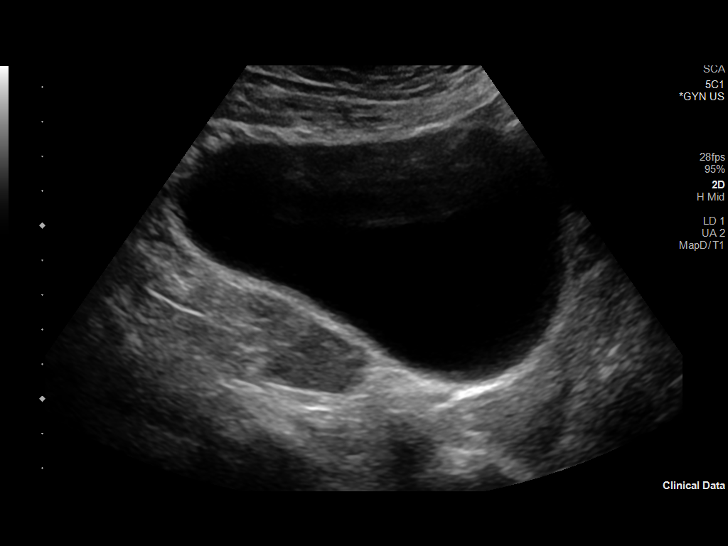
[im 40/79]
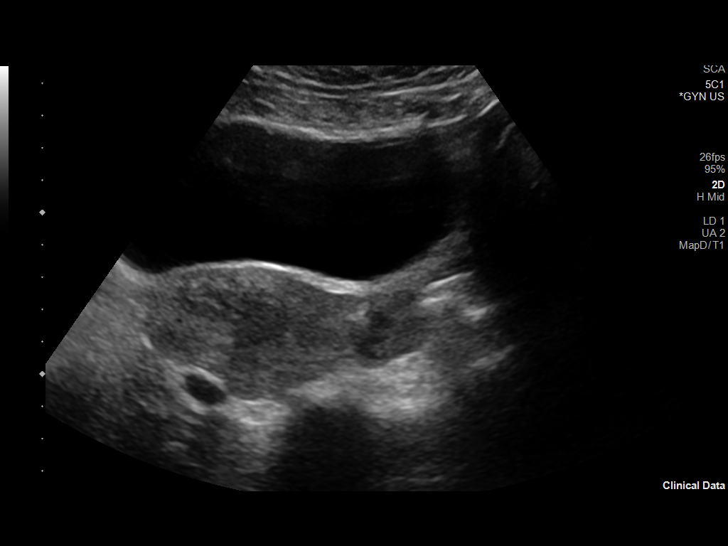
[im 46/79]
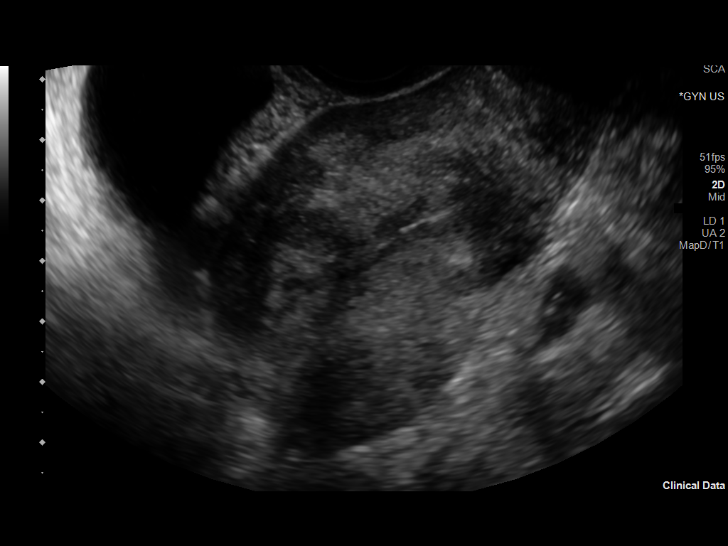
[im 53/79]
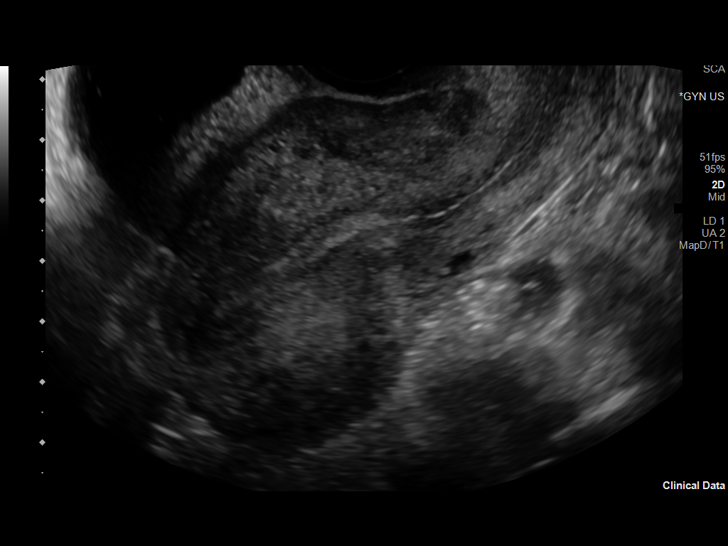
[im 59/79]
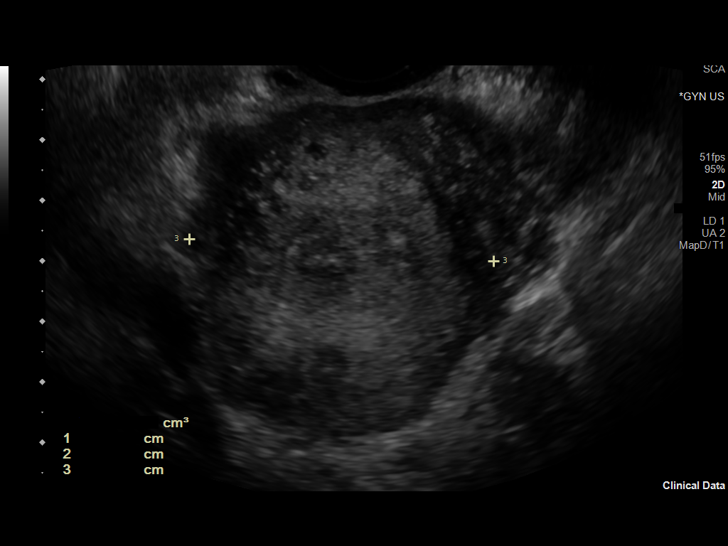
[im 66/79]
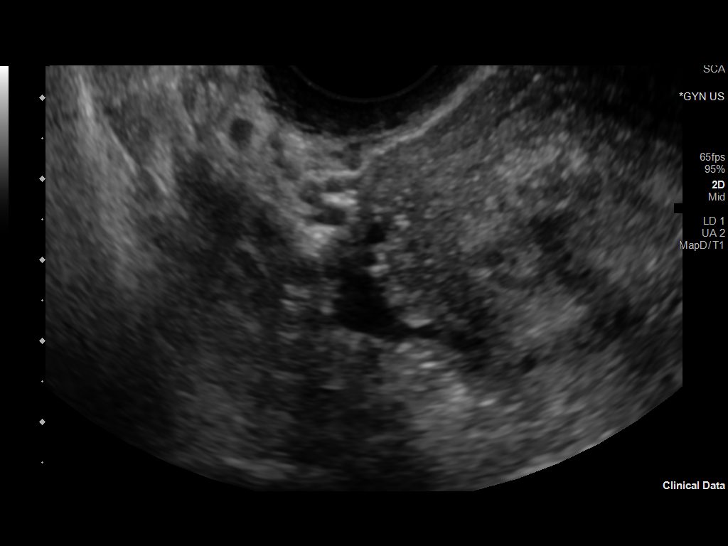
[im 72/79]
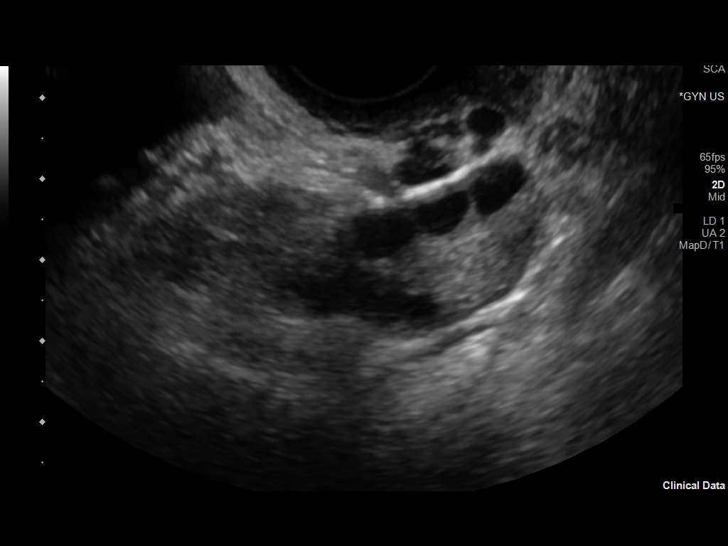
[im 79/79]
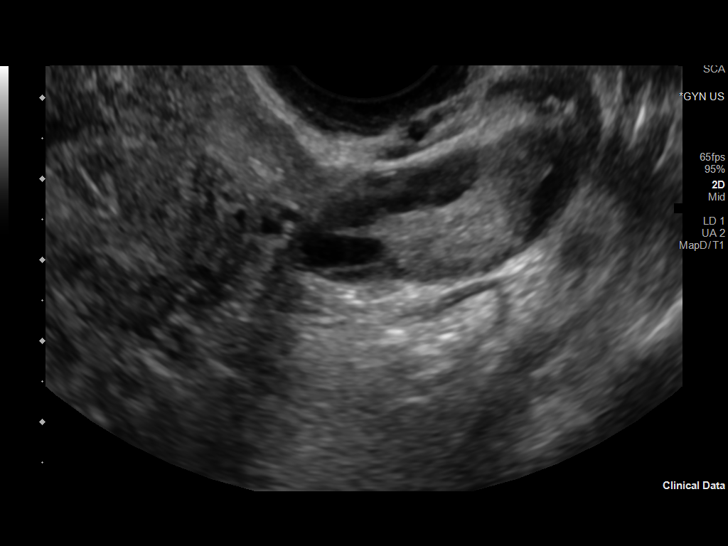

[13 of 25 positions shown; findings below may reference images not displayed]

FINDINGS: Uterus

Measurements: 11.4 x 4.3 x 5.3 cm = volume: 136 mL. Postsurgical
changes from prior myomectomy with removal of several bulky large
uterine fibroids. Heterogeneous focus in the right anterolateral
uterine fundus measures 0.9 x 1.2 x 1.3 cm possibly reflecting some
residual leiomyoma.

Endometrium

Thickness: 4.8 mm, non thickened.  No focal abnormality visualized.

Right ovary

Measurements: 3.8 x 2.2 x 2.2 cm = volume: 9.3 mL. Poorly visualized
transvaginally, unremarkable transabdominal appearance.

Left ovary

Measurements: 2.9 x 1.6 x 2.8 cm = volume: 7.1 mL. Normal
appearance. No concerning adnexal mass.

Other findings

No abnormal free fluid.
IMPRESSION: Patient is post myomectomy with debulking of several large uterine
leiomyomata seen on comparison sonography.

Heterogeneous 1.3 cm focus towards the right lateral uterine fundus
may reflect some residual leiomyoma.

Overall fibroid burden significantly diminished from comparison
prior.

## 2022-10-20 ENCOUNTER — Other Ambulatory Visit (HOSPITAL_BASED_OUTPATIENT_CLINIC_OR_DEPARTMENT_OTHER): Payer: Self-pay

## 2022-11-17 ENCOUNTER — Telehealth (INDEPENDENT_AMBULATORY_CARE_PROVIDER_SITE_OTHER): Payer: Medicaid Other | Admitting: Obstetrics & Gynecology

## 2022-11-17 ENCOUNTER — Encounter: Payer: Self-pay | Admitting: Obstetrics & Gynecology

## 2022-11-17 DIAGNOSIS — N898 Other specified noninflammatory disorders of vagina: Secondary | ICD-10-CM | POA: Diagnosis not present

## 2022-11-17 NOTE — Progress Notes (Signed)
   TELEHEALTH GYNECOLOGY VISIT ENCOUNTER NOTE  Provider location: Center for [facility]   Patient location: Home  I connected with Carmen Hays on 11/17/22 at  1:10 PM EDT by telephone and verified that I am speaking with the correct person using two identifiers. Patient was unable to do MyChart audiovisual encounter due to technical difficulties, she tried several times.    I discussed the limitations, risks, security and privacy concerns of performing an evaluation and management service by telephone and the availability of in person appointments. I also discussed with the patient that there may be a patient responsible charge related to this service. The patient expressed understanding and agreed to proceed.   History:  Carmen Hays is a 37 y.o. G0P0000 female being evaluated today for uterine fibroids. She is not having sx related to fibroids. However, on the last day of her cycle, she had old brown thick discharge for 3 days. After the bleeding resolved, she took an 'OTC BV med' that worked and her sx resolved.    Pt is currently in [place] [facility] for a short term contract working for [employer]. She is one of the [facility] in the [employer] story!  She is trying to go to [place] next to work in the [employer] production!     Past Medical History:  Diagnosis Date   Anemia    Bronchitis    h/o, seasonal   Past Surgical History:  Procedure Laterality Date   MYOMECTOMY N/A 06/19/2019   Procedure: ABDOMINAL MYOMECTOMY;  Surgeon: Willodean Rosenthal, MD;  Location: [facility];  Service: Gynecology;  Laterality: N/A;   The following portions of the patient's history were reviewed and updated as appropriate: allergies, current medications, past family history, past medical history, past social history, past surgical history and problem list.    Review of Systems:  Pertinent items noted in HPI and remainder of comprehensive ROS otherwise negative.  Physical Exam:   General:   Alert, oriented and cooperative.   Mental Status: Normal mood and affect perceived. Normal judgment and thought content.  Physical exam deferred due to nature of the encounter  Labs and Imaging No results found for this or any previous visit (from the past 336 hour(s)). No results found.    Assessment and Plan:     Abnormal discharge.   Sx resolved  Pt will call if sx return Fibroids  No change noted  Had questions about possibel future tx for fibroids and affect on fertility.   Reviewed with pt. ([place] and [facility]).        I discussed the assessment and treatment plan with the patient. The patient was provided an opportunity to ask questions and all were answered. The patient agreed with the plan and demonstrated an understanding of the instructions.   The patient was advised to call back or seek an in-person evaluation/go to the ED if the symptoms worsen or if the condition fails to improve as anticipated.  I provided 20 minutes of non-face-to-face time during this encounter including conversation and charting review.    Willodean Rosenthal, MD Center for [employer], [facility]
